# Patient Record
Sex: Female | Born: 1950
Health system: Southern US, Community
[De-identification: ages and names within clinical notes are randomized; demographics above are authoritative.]

## PROBLEM LIST (undated history)

## (undated) DIAGNOSIS — I480 Paroxysmal atrial fibrillation: Secondary | ICD-10-CM

## (undated) DIAGNOSIS — E069 Thyroiditis, unspecified: Secondary | ICD-10-CM

## (undated) DIAGNOSIS — E785 Hyperlipidemia, unspecified: Secondary | ICD-10-CM

## (undated) DIAGNOSIS — E038 Other specified hypothyroidism: Secondary | ICD-10-CM

## (undated) HISTORY — DX: Hyperlipidemia, unspecified: E78.5

## (undated) HISTORY — PX: OTHER SURGICAL HISTORY: SHX169

## (undated) HISTORY — DX: Paroxysmal atrial fibrillation: I48.0

## (undated) HISTORY — DX: Thyroiditis, unspecified: E06.9

## (undated) HISTORY — DX: Other specified hypothyroidism: E03.8

---

## 1998-04-20 ENCOUNTER — Other Ambulatory Visit: Admission: RE | Admit: 1998-04-20 | Discharge: 1998-04-20 | Payer: Self-pay | Admitting: Obstetrics and Gynecology

## 1999-05-15 ENCOUNTER — Ambulatory Visit (HOSPITAL_COMMUNITY): Admission: RE | Admit: 1999-05-15 | Discharge: 1999-05-15 | Payer: Self-pay | Admitting: Obstetrics and Gynecology

## 1999-05-15 ENCOUNTER — Encounter: Payer: Self-pay | Admitting: Obstetrics and Gynecology

## 2000-05-19 ENCOUNTER — Ambulatory Visit (HOSPITAL_COMMUNITY): Admission: RE | Admit: 2000-05-19 | Discharge: 2000-05-19 | Payer: Self-pay | Admitting: Obstetrics and Gynecology

## 2000-05-19 ENCOUNTER — Encounter: Payer: Self-pay | Admitting: Obstetrics and Gynecology

## 2001-06-05 ENCOUNTER — Encounter: Payer: Self-pay | Admitting: Obstetrics and Gynecology

## 2001-06-05 ENCOUNTER — Ambulatory Visit (HOSPITAL_COMMUNITY): Admission: RE | Admit: 2001-06-05 | Discharge: 2001-06-05 | Payer: Self-pay | Admitting: Obstetrics and Gynecology

## 2002-06-07 ENCOUNTER — Encounter: Payer: Self-pay | Admitting: Obstetrics and Gynecology

## 2002-06-07 ENCOUNTER — Ambulatory Visit (HOSPITAL_COMMUNITY): Admission: RE | Admit: 2002-06-07 | Discharge: 2002-06-07 | Payer: Self-pay | Admitting: Obstetrics and Gynecology

## 2003-06-23 ENCOUNTER — Ambulatory Visit (HOSPITAL_COMMUNITY): Admission: RE | Admit: 2003-06-23 | Discharge: 2003-06-23 | Payer: Self-pay | Admitting: Obstetrics and Gynecology

## 2003-06-23 ENCOUNTER — Encounter: Payer: Self-pay | Admitting: Obstetrics and Gynecology

## 2004-07-23 ENCOUNTER — Ambulatory Visit (HOSPITAL_COMMUNITY): Admission: RE | Admit: 2004-07-23 | Discharge: 2004-07-23 | Payer: Self-pay | Admitting: Obstetrics and Gynecology

## 2005-09-25 ENCOUNTER — Ambulatory Visit (HOSPITAL_COMMUNITY): Admission: RE | Admit: 2005-09-25 | Discharge: 2005-09-25 | Payer: Self-pay | Admitting: Obstetrics and Gynecology

## 2006-09-26 ENCOUNTER — Ambulatory Visit (HOSPITAL_COMMUNITY): Admission: RE | Admit: 2006-09-26 | Discharge: 2006-09-26 | Payer: Self-pay | Admitting: Obstetrics and Gynecology

## 2007-09-28 ENCOUNTER — Ambulatory Visit (HOSPITAL_COMMUNITY): Admission: RE | Admit: 2007-09-28 | Discharge: 2007-09-28 | Payer: Self-pay | Admitting: Obstetrics and Gynecology

## 2008-09-01 ENCOUNTER — Other Ambulatory Visit: Admission: RE | Admit: 2008-09-01 | Discharge: 2008-09-01 | Payer: Self-pay | Admitting: Family Medicine

## 2008-10-25 ENCOUNTER — Ambulatory Visit (HOSPITAL_COMMUNITY): Admission: RE | Admit: 2008-10-25 | Discharge: 2008-10-25 | Payer: Self-pay | Admitting: Family Medicine

## 2009-11-16 ENCOUNTER — Ambulatory Visit (HOSPITAL_COMMUNITY): Admission: RE | Admit: 2009-11-16 | Discharge: 2009-11-16 | Payer: Self-pay | Admitting: Family Medicine

## 2010-03-23 ENCOUNTER — Other Ambulatory Visit: Admission: RE | Admit: 2010-03-23 | Discharge: 2010-03-23 | Payer: Self-pay | Admitting: Family Medicine

## 2010-11-08 ENCOUNTER — Other Ambulatory Visit (HOSPITAL_COMMUNITY): Payer: Self-pay | Admitting: Family Medicine

## 2010-11-08 DIAGNOSIS — Z1231 Encounter for screening mammogram for malignant neoplasm of breast: Secondary | ICD-10-CM

## 2010-11-08 DIAGNOSIS — Z0000 Encounter for general adult medical examination without abnormal findings: Secondary | ICD-10-CM

## 2010-11-28 ENCOUNTER — Ambulatory Visit (HOSPITAL_COMMUNITY): Payer: BC Managed Care – PPO | Attending: Family Medicine

## 2010-11-28 ENCOUNTER — Ambulatory Visit (HOSPITAL_COMMUNITY): Admission: RE | Admit: 2010-11-28 | Payer: Self-pay | Source: Home / Self Care | Admitting: Family Medicine

## 2011-05-28 ENCOUNTER — Other Ambulatory Visit (HOSPITAL_COMMUNITY)
Admission: RE | Admit: 2011-05-28 | Discharge: 2011-05-28 | Disposition: A | Discharge status: Home / Self Care | Payer: BC Managed Care – PPO | Source: Ambulatory Visit | Attending: Family Medicine | Admitting: Family Medicine

## 2011-05-28 ENCOUNTER — Other Ambulatory Visit: Payer: Self-pay | Admitting: Family Medicine

## 2011-05-28 DIAGNOSIS — Z124 Encounter for screening for malignant neoplasm of cervix: Secondary | ICD-10-CM | POA: Insufficient documentation

## 2011-08-29 ENCOUNTER — Ambulatory Visit (HOSPITAL_COMMUNITY)
Admission: RE | Admit: 2011-08-29 | Discharge: 2011-08-29 | Disposition: A | Discharge status: Home / Self Care | Payer: BC Managed Care – PPO | Source: Ambulatory Visit | Attending: Family Medicine | Admitting: Family Medicine

## 2011-08-29 DIAGNOSIS — Z1231 Encounter for screening mammogram for malignant neoplasm of breast: Secondary | ICD-10-CM | POA: Insufficient documentation

## 2012-08-26 ENCOUNTER — Other Ambulatory Visit: Payer: Self-pay | Admitting: Dermatology

## 2013-08-19 ENCOUNTER — Other Ambulatory Visit (HOSPITAL_COMMUNITY): Payer: Self-pay | Admitting: Family Medicine

## 2013-08-19 DIAGNOSIS — Z1231 Encounter for screening mammogram for malignant neoplasm of breast: Secondary | ICD-10-CM

## 2013-09-02 ENCOUNTER — Ambulatory Visit (HOSPITAL_COMMUNITY)
Admission: RE | Admit: 2013-09-02 | Discharge: 2013-09-02 | Disposition: A | Discharge status: Home / Self Care | Payer: BC Managed Care – PPO | Source: Ambulatory Visit | Attending: Family Medicine | Admitting: Family Medicine

## 2013-09-02 DIAGNOSIS — Z1231 Encounter for screening mammogram for malignant neoplasm of breast: Secondary | ICD-10-CM

## 2014-01-06 ENCOUNTER — Other Ambulatory Visit (HOSPITAL_COMMUNITY)
Admission: RE | Admit: 2014-01-06 | Discharge: 2014-01-06 | Disposition: A | Discharge status: Home / Self Care | Payer: BC Managed Care – PPO | Source: Ambulatory Visit | Attending: Family Medicine | Admitting: Family Medicine

## 2014-01-06 ENCOUNTER — Other Ambulatory Visit: Payer: Self-pay | Admitting: Family Medicine

## 2014-01-06 DIAGNOSIS — Z124 Encounter for screening for malignant neoplasm of cervix: Secondary | ICD-10-CM | POA: Insufficient documentation

## 2014-08-30 ENCOUNTER — Other Ambulatory Visit (HOSPITAL_COMMUNITY): Payer: Self-pay | Admitting: Family Medicine

## 2014-08-30 DIAGNOSIS — Z1231 Encounter for screening mammogram for malignant neoplasm of breast: Secondary | ICD-10-CM

## 2014-09-23 ENCOUNTER — Ambulatory Visit (HOSPITAL_COMMUNITY)
Admission: RE | Admit: 2014-09-23 | Discharge: 2014-09-23 | Disposition: A | Discharge status: Home / Self Care | Payer: BC Managed Care – PPO | Source: Ambulatory Visit | Attending: Family Medicine | Admitting: Family Medicine

## 2014-09-23 DIAGNOSIS — Z1231 Encounter for screening mammogram for malignant neoplasm of breast: Secondary | ICD-10-CM | POA: Diagnosis not present

## 2014-09-29 ENCOUNTER — Other Ambulatory Visit: Payer: Self-pay | Admitting: Family Medicine

## 2014-09-29 DIAGNOSIS — R1032 Left lower quadrant pain: Secondary | ICD-10-CM

## 2014-10-04 ENCOUNTER — Other Ambulatory Visit: Payer: BC Managed Care – PPO

## 2014-10-10 ENCOUNTER — Ambulatory Visit
Admission: RE | Admit: 2014-10-10 | Discharge: 2014-10-10 | Disposition: A | Discharge status: Home / Self Care | Payer: BC Managed Care – PPO | Source: Ambulatory Visit | Attending: Family Medicine | Admitting: Family Medicine

## 2014-10-10 DIAGNOSIS — R1032 Left lower quadrant pain: Secondary | ICD-10-CM

## 2015-09-01 ENCOUNTER — Other Ambulatory Visit: Payer: Self-pay

## 2015-09-01 DIAGNOSIS — Z1231 Encounter for screening mammogram for malignant neoplasm of breast: Secondary | ICD-10-CM

## 2015-10-11 ENCOUNTER — Ambulatory Visit
Admission: RE | Admit: 2015-10-11 | Discharge: 2015-10-11 | Disposition: A | Discharge status: Home / Self Care | Payer: BC Managed Care – PPO | Source: Ambulatory Visit

## 2015-10-11 DIAGNOSIS — Z1231 Encounter for screening mammogram for malignant neoplasm of breast: Secondary | ICD-10-CM

## 2016-09-15 ENCOUNTER — Emergency Department (HOSPITAL_BASED_OUTPATIENT_CLINIC_OR_DEPARTMENT_OTHER)
Admission: EM | Admit: 2016-09-15 | Discharge: 2016-09-15 | Disposition: A | Discharge status: Home / Self Care | Payer: BC Managed Care – PPO | Attending: Emergency Medicine | Admitting: Emergency Medicine

## 2016-09-15 ENCOUNTER — Encounter (HOSPITAL_BASED_OUTPATIENT_CLINIC_OR_DEPARTMENT_OTHER): Payer: Self-pay | Admitting: Adult Health

## 2016-09-15 ENCOUNTER — Emergency Department (HOSPITAL_BASED_OUTPATIENT_CLINIC_OR_DEPARTMENT_OTHER): Payer: BC Managed Care – PPO

## 2016-09-15 DIAGNOSIS — Y999 Unspecified external cause status: Secondary | ICD-10-CM | POA: Insufficient documentation

## 2016-09-15 DIAGNOSIS — Z79899 Other long term (current) drug therapy: Secondary | ICD-10-CM | POA: Insufficient documentation

## 2016-09-15 DIAGNOSIS — Y93F2 Activity, caregiving, lifting: Secondary | ICD-10-CM | POA: Diagnosis not present

## 2016-09-15 DIAGNOSIS — M546 Pain in thoracic spine: Secondary | ICD-10-CM | POA: Diagnosis not present

## 2016-09-15 DIAGNOSIS — X500XXA Overexertion from strenuous movement or load, initial encounter: Secondary | ICD-10-CM | POA: Diagnosis not present

## 2016-09-15 DIAGNOSIS — Z87891 Personal history of nicotine dependence: Secondary | ICD-10-CM | POA: Insufficient documentation

## 2016-09-15 DIAGNOSIS — M549 Dorsalgia, unspecified: Secondary | ICD-10-CM

## 2016-09-15 DIAGNOSIS — Y929 Unspecified place or not applicable: Secondary | ICD-10-CM | POA: Insufficient documentation

## 2016-09-15 DIAGNOSIS — M7918 Myalgia, other site: Secondary | ICD-10-CM

## 2016-09-15 DIAGNOSIS — S299XXA Unspecified injury of thorax, initial encounter: Secondary | ICD-10-CM | POA: Diagnosis present

## 2016-09-15 LAB — CBC
HCT: 42.7 % (ref 36.0–46.0)
Hemoglobin: 14.1 g/dL (ref 12.0–15.0)
MCH: 29.6 pg (ref 26.0–34.0)
MCHC: 33 g/dL (ref 30.0–36.0)
MCV: 89.5 fL (ref 78.0–100.0)
PLATELETS: 257 10*3/uL (ref 150–400)
RBC: 4.77 MIL/uL (ref 3.87–5.11)
RDW: 13.5 % (ref 11.5–15.5)
WBC: 7.7 10*3/uL (ref 4.0–10.5)

## 2016-09-15 LAB — BASIC METABOLIC PANEL
Anion gap: 11 (ref 5–15)
BUN: 18 mg/dL (ref 6–20)
CALCIUM: 9.4 mg/dL (ref 8.9–10.3)
CHLORIDE: 105 mmol/L (ref 101–111)
CO2: 25 mmol/L (ref 22–32)
CREATININE: 0.99 mg/dL (ref 0.44–1.00)
GFR calc Af Amer: >60 mL/min (ref >60)
GFR, EST NON AFRICAN AMERICAN: 59 mL/min — AB (ref >60)
Glucose, Bld: 98 mg/dL (ref 65–99)
Potassium: 3.4 mmol/L — ABNORMAL LOW (ref 3.5–5.1)
SODIUM: 141 mmol/L (ref 135–145)

## 2016-09-15 LAB — TROPONIN I: Troponin I: <0.03 ng/mL (ref <0.03)

## 2016-09-15 MED ORDER — NAPROXEN 250 MG PO TABS
500.0000 mg | ORAL_TABLET | Freq: Once | ORAL | Status: AC
  Administered 2016-09-15: 500 mg via ORAL
  Filled 2016-09-15: qty 2

## 2016-09-15 NOTE — ED Provider Notes (Signed)
[facility] Provider Note   CSN: [id number] Arrival date & time: 09/15/16  1229     History   Chief Complaint Chief Complaint  Patient presents with  . Chest Pain    HPI Nicole Haynes is a 65 y.o. female.  HPI 65 y.o. female, presents to the Emergency Department today due to pain between shoulder blades  For several weeks. Sent by UC to r/o MI. Notes fatigue, shortness of breath while lying down. Pt does not lifting furniture several weeks ago when original pain started between shoulder blades. No nausea. Notes pain a dull ache and rates 1/10. Notes no N/V. No fevers. No diaphoresis. No numbness/tingling. No radiation. No hx MI in past. No FH ACS. Risk Factors: HLD. Has never ad a stress test. No recent travel. No hx DVT/PE. No recent surgeries. No other symptoms noted.  Past Medical History:  Diagnosis Date  . Thyroid disease     There are no active problems to display for this patient.   History reviewed. No pertinent surgical history.  OB History    No data available       Home Medications    Prior to Admission medications   Medication Sig Start Date End Date Taking? Authorizing Provider  levothyroxine (SYNTHROID, LEVOTHROID) 125 MCG tablet Take 125 mcg by mouth daily before breakfast.   Yes Historical Provider, MD    Family History History reviewed. No pertinent family history.  Social History Social History  Substance Use Topics  . Smoking status: Former Games developermoker  . Smokeless tobacco: Never Used  . Alcohol use Yes     Comment: wine     Allergies   Penicillins   Review of Systems Review of Systems ROS reviewed and all are negative for acute change except as noted in the HPI.  Physical Exam Updated Vital Signs BP 157/83   Pulse 83   Temp 98.2 F (36.8 C) (Oral)   Resp 19   Ht 5\' 4"  (1.626 m)   Wt 65.8 kg   SpO2 100%   BMI 24.89 kg/m   Physical Exam  Constitutional: She is oriented to person, place, and time. Vital signs  are normal. She appears well-developed and well-nourished.  HENT:  Head: Normocephalic.  Right Ear: Hearing normal.  Left Ear: Hearing normal.  Eyes: Conjunctivae and EOM are normal. Pupils are equal, round, and reactive to light.  Neck: Normal range of motion. Neck supple.  Cardiovascular: Normal rate, regular rhythm, normal heart sounds and intact distal pulses.   Pulmonary/Chest: Effort normal and breath sounds normal.  Musculoskeletal: Normal range of motion.  Neurological: She is alert and oriented to person, place, and time.  Skin: Skin is warm and dry.  Psychiatric: She has a normal mood and affect. Her speech is normal and behavior is normal. Thought content normal.  Nursing note and vitals reviewed.  ED Treatments / Results  Labs (all labs ordered are listed, but only abnormal results are displayed) Labs Reviewed  BASIC METABOLIC PANEL - Abnormal; Notable for the following:       Result Value   Potassium 3.4 (*)    GFR calc non Af Amer 59 (*)    All other components within normal limits  CBC  TROPONIN I  TROPONIN I   EKG  EKG Interpretation  Date/Time:  Sunday September 15 2016 13:30:20 EST Ventricular Rate:  80 PR Interval:    QRS Duration: 98 QT Interval:  386 QTC Calculation: 446 R Axis:   -6  Text Interpretation:  Sinus rhythm Low voltage, precordial leads No old tracing to compare Confirmed by [facility]  MD, MARTHA [phone]) on 09/15/2016 1:33:22 PM Also confirmed by Karma GanjaLINKER  MD, MARTHA [phone]), editor [place]  CCT, BEVERLY ([id number])  on 09/15/2016 1:47:50 PM      Radiology Dg Chest 2 View  Result Date: 09/15/2016 CLINICAL DATA:  Upper back pain with shortness of Breath EXAM: CHEST  2 VIEW COMPARISON:  None. FINDINGS: Cardiac shadow is within normal limits. Lungs are well aerated bilaterally. Minimal atelectatic changes are noted in the lingula. No sizable effusion is seen. No bony abnormality is noted. IMPRESSION: Mild lingular atelectasis. Electronically Signed    By: Alcide CleverMark  Lukens M.D.   On: 09/15/2016 14:23    Procedures Procedures (including critical care time)  Medications Ordered in ED Medications - No data to display  Initial Impression / Assessment and Plan / ED Course  I have reviewed the triage vital signs and the nursing notes.  Pertinent labs & imaging results that were available during my care of the patient were reviewed by me and considered in my medical decision making (see chart for details).  Clinical Course    Final Clinical Impressions(s) / ED Diagnoses  {I have reviewed and evaluated the relevant laboratory values. {I have reviewed and evaluated the relevant imaging studies. {I have interpreted the relevant EKG. {I have reviewed the relevant previous healthcare records.  {I obtained HPI from historian. {Patient discussed with supervising physician.  ED Course:  Assessment: Pt is a 65yF presents with shoulder pain in back x several weeks s/p lifting injury. Continued pain. Seen by UC and sent to r/o MI.Marland Kitchen. Risk Factors HLD. Patient is to be discharged with recommendation to follow up with PCP in regards to today's hospital visit. Chest pain is not likely of cardiac or pulmonary etiology d/t presentation, VSS, no tracheal deviation, no JVD or new murmur, RRR, breath sounds equal bilaterally, EKG without acute abnormalities, negative troponin, and negative CXR. Heart Score 2. Low indication for PE as pt without risk factors. Non pleuritic pain. VSS. Does not meet Wells Criteria. Improvement of pain with antiinflammatories. Will DC patient home when delta troponin negative. Pt has been advised start a NSAIDs and return to the ED is CP becomes exertional, associated with diaphoresis or nausea, radiates to left jaw/arm, worsens or becomes concerning in any way. Pt appears reliable for follow up and is agreeable to discharge if delta troponin unremarkable. Patient is in no acute distress. Vital Signs are stable. Patient is able to ambulate.  Patient able to tolerate PO.   Disposition/Plan:  DC Home Additional Verbal discharge instructions given and discussed with patient.  Pt Instructed to f/u with PCP in the next week for evaluation and treatment of symptoms. Return precautions given Pt acknowledges and agrees with plan  Supervising Physician Jerelyn ScottMartha Linker, MD  Final diagnoses:  Upper back pain  Musculoskeletal pain    New Prescriptions New Prescriptions   No medications on file     Audry Piliyler Dayton Sherr, PA-C 09/15/16 1630    Jerelyn ScottMartha Linker, MD 09/19/16 [phone]

## 2016-09-15 NOTE — ED Triage Notes (Addendum)
PT sent over from [facility] to R/O MI, Endorses pain between the shoulder blades for a few weeks and it has progressed to fatigue, palpitations and SOB when lying down. Denies nausea. Pain comes and goes.  Nothing makes pain worse, nothing makes pain better, pain is described a sa dull pain/ache

## 2016-09-15 NOTE — ED Notes (Signed)
ED Provider at bedside. 

## 2016-09-15 NOTE — Discharge Instructions (Addendum)
Please read and follow all provided instructions.  Your diagnoses today include:  1. Upper back pain   2. Musculoskeletal pain    Tests performed today include: An EKG of your heart A chest x-ray Cardiac enzymes - a blood test for heart muscle damage Blood counts and electrolytes Vital signs. See below for your results today.   Medications prescribed:   Take any prescribed medications only as directed.  Follow-up instructions: Please follow-up with your primary care provider as soon as you can for further evaluation of your symptoms. Likely they will schedule a STRESS TEST to further evaluate any Cardiac etiology of the discomfort you are having.   Return instructions:  SEEK IMMEDIATE MEDICAL ATTENTION IF: You have severe chest pain, especially if the pain is crushing or pressure-like and spreads to the arms, back, neck, or jaw, or if you have sweating, nausea (feeling sick to your stomach), or shortness of breath. THIS IS AN EMERGENCY. Don't wait to see if the pain will go away. Get medical help at once. Call 911 or 0 (operator). DO NOT drive yourself to the hospital.  Your chest pain gets worse and does not go away with rest.  You have an attack of chest pain lasting longer than usual, despite rest and treatment with the medications your caregiver has prescribed.  You wake from sleep with chest pain or shortness of breath. You feel dizzy or faint. You have chest pain not typical of your usual pain for which you originally saw your caregiver.  You have any other emergent concerns regarding your health.  Additional Information: Chest pain comes from many different causes. Your caregiver has diagnosed you as having chest pain that is not specific for one problem, but does not require admission.  You are at low risk for an acute heart condition or other serious illness.   Your vital signs today were: BP 157/83    Pulse 83    Temp 98.2 F (36.8 C) (Oral)    Resp 19    Ht 5\' 4"  (1.626  m)    Wt 65.8 kg    SpO2 100%    BMI 24.89 kg/m  If your blood pressure (BP) was elevated above 135/85 this visit, please have this repeated by your doctor within one month. --------------

## 2016-09-20 ENCOUNTER — Other Ambulatory Visit: Payer: Self-pay | Admitting: Family Medicine

## 2016-09-20 DIAGNOSIS — Z1231 Encounter for screening mammogram for malignant neoplasm of breast: Secondary | ICD-10-CM

## 2016-10-31 ENCOUNTER — Ambulatory Visit
Admission: RE | Admit: 2016-10-31 | Discharge: 2016-10-31 | Disposition: A | Discharge status: Home / Self Care | Payer: BC Managed Care – PPO | Source: Ambulatory Visit | Attending: Family Medicine | Admitting: Family Medicine

## 2016-10-31 DIAGNOSIS — Z1231 Encounter for screening mammogram for malignant neoplasm of breast: Secondary | ICD-10-CM

## 2017-10-01 ENCOUNTER — Other Ambulatory Visit: Payer: Self-pay | Admitting: Family Medicine

## 2017-10-01 DIAGNOSIS — Z1231 Encounter for screening mammogram for malignant neoplasm of breast: Secondary | ICD-10-CM

## 2017-11-03 ENCOUNTER — Ambulatory Visit
Admission: RE | Admit: 2017-11-03 | Discharge: 2017-11-03 | Disposition: A | Discharge status: Home / Self Care | Payer: BC Managed Care – PPO | Source: Ambulatory Visit | Attending: Family Medicine | Admitting: Family Medicine

## 2017-11-03 ENCOUNTER — Other Ambulatory Visit: Payer: Self-pay | Admitting: Family Medicine

## 2017-11-03 ENCOUNTER — Ambulatory Visit (INDEPENDENT_AMBULATORY_CARE_PROVIDER_SITE_OTHER): Payer: Medicare Other

## 2017-11-03 DIAGNOSIS — R002 Palpitations: Secondary | ICD-10-CM | POA: Diagnosis not present

## 2017-11-03 DIAGNOSIS — Z1231 Encounter for screening mammogram for malignant neoplasm of breast: Secondary | ICD-10-CM

## 2017-11-04 ENCOUNTER — Other Ambulatory Visit: Payer: Self-pay | Admitting: Family Medicine

## 2017-11-04 DIAGNOSIS — R928 Other abnormal and inconclusive findings on diagnostic imaging of breast: Secondary | ICD-10-CM

## 2017-11-06 ENCOUNTER — Ambulatory Visit
Admission: RE | Admit: 2017-11-06 | Discharge: 2017-11-06 | Disposition: A | Discharge status: Home / Self Care | Payer: Medicare Other | Source: Ambulatory Visit | Attending: Family Medicine | Admitting: Family Medicine

## 2017-11-06 ENCOUNTER — Other Ambulatory Visit: Payer: Self-pay | Admitting: Family Medicine

## 2017-11-06 DIAGNOSIS — R928 Other abnormal and inconclusive findings on diagnostic imaging of breast: Secondary | ICD-10-CM

## 2017-11-18 ENCOUNTER — Telehealth: Payer: Self-pay | Admitting: Nurse Practitioner

## 2017-11-18 NOTE — Telephone Encounter (Signed)
Received monitor report from [employer] dated 11/14/17 @ 4:11 pm (CT) which reports patient having atrial fib with RVR. I reviewed the monitor report with Dr. Mayford Knifeurner, DOD, who advised the monitor shows atrial tachycardia. She advised that I notify Dr. Valentina LucksGriffin, the ordering physician. I left a message at Dr. Jone BasemanGriffin's office with receptionist who states Dr. Valentina LucksGriffin is out of the office today. She repeated the message back to me and thanked me for the call.

## 2017-11-21 ENCOUNTER — Encounter: Payer: Self-pay | Admitting: Cardiology

## 2017-11-21 ENCOUNTER — Ambulatory Visit: Payer: Medicare Other | Admitting: Cardiology

## 2017-11-21 VITALS — BP 122/62 | Ht 64.0 in | Wt 144.0 lb

## 2017-11-21 DIAGNOSIS — I471 Supraventricular tachycardia: Secondary | ICD-10-CM | POA: Diagnosis not present

## 2017-11-21 DIAGNOSIS — I480 Paroxysmal atrial fibrillation: Secondary | ICD-10-CM | POA: Insufficient documentation

## 2017-11-21 HISTORY — PX: OTHER SURGICAL HISTORY: SHX169

## 2017-11-21 MED ORDER — ASPIRIN EC 81 MG PO TBEC: 81.0000 mg | DELAYED_RELEASE_TABLET | Freq: Every day | ORAL | 3 refills | Status: DC

## 2017-11-21 MED ORDER — DILTIAZEM HCL ER COATED BEADS 120 MG PO CP24: 120.0000 mg | ORAL_CAPSULE | Freq: Every day | ORAL | 6 refills | Status: DC

## 2017-11-21 NOTE — Progress Notes (Signed)
PCP: Maurice Small, MD  Clinic Note: Chief Complaint  Patient presents with  . New Patient (Initial Visit)  . Palpitations    HPI: Nicole Haynes is a 67 y.o. female with a PMH below who presents today for evaluation of rapid irregular heartbeats (likely atrial fibrillation on monitor) at the request of Maurice Small, MD.  Nicole Haynes was seen on October 24, 2017 with complaints of heart racing and skipping.  They usually happen in the evening when she is quiet and still.  Lasting up to 30 minutes at a time.  No associated chest pain or shortness of breath.  Had noticed episodes going off and on for about a year but worse now. Mother has history of some type of arrhythmia and ended up with a pacemaker.   30-day event monitor ordered --> Dr. Carolanne Grumbling (on call "cardiology fellow ") was notified of rapid rhythm, diagnosed telephonically as atrial tachycardia.  Recent Hospitalizations: None  Studies Personally Reviewed - (if available, images/films reviewed: From Epic Chart or Care Everywhere)  3 event monitor reports were obtained prior to completion of the monitor.  These episodes show a standard sinus bradycardia/sinus rhythm for baseline followed by 3 episodes of atrial fibrillation with RVR rates up to 130 bpm. -->  On my review, this clearly appears to be atrial fibrillation and not atrial tachycardia.  Episodes occurred on January 23, January 27 and January 28.  Nicole Haynes presents today still noticing these rapid heart rate spells.  Again she tells me that  Interval History: She really only notes that at nighttime when she has had time to calm down and relax.  She notes that they can last anywhere from a few minutes to up to 30 and 40 minutes.  Up until last month, they may have happened once or twice a month, but now are happening with increased frequency.  Last major episode she felt was Christmas time, but since wearing the monitor, she has noted at least 2 episodes where  she was contacted, that she indicated having symptoms. She describes it as a irregular fast heartbeat that makes her feel short of breath when it happens, but not otherwise.  If the discomfort in her chest, but not pain.  She has not had any syncope or near syncope type episodes but she had a prolonged episode where she felt like she "freaked out a bit "and almost passed out.  Otherwise, has not had near syncopal symptoms just the dizziness.  No true syncope.  When she is not having these episodes, she really is a systematic from a cardiac standpoint.  No chest pain or shortness of breath with rest or exertion.  No PND, orthopnea or edema.  No TIA/amaurosis fugax symptoms. No melena, hematochezia, hematuria, or epstaxis. No claudication.  She has not had her thyroid medication changed recently.  She does drink roughly 2-2-1/2 cups of coffee a day, but denies any other type of stimulant use.  ROS: A comprehensive was performed. Review of Systems  Constitutional: Negative for malaise/fatigue and weight loss.  HENT: Negative for congestion and nosebleeds.   Respiratory: Negative for cough, shortness of breath and wheezing.   Gastrointestinal: Negative for abdominal pain, constipation, diarrhea and heartburn.  Genitourinary: Negative for dysuria.  Musculoskeletal: Negative for back pain, joint pain and myalgias.  Neurological: Positive for dizziness. Negative for focal weakness, seizures and loss of consciousness.  Endo/Heme/Allergies: Does not bruise/bleed easily.  Psychiatric/Behavioral: The patient is nervous/anxious (She does get anxious when  these spells last long time.).   All other systems reviewed and are negative.   I have reviewed and (if needed) personally updated the patient's problem list, medications, allergies, past medical and surgical history, social and family history.   Past Medical History:  Diagnosis Date  . Dyslipidemia    On rosuvastatin, Krill oral  .  Hypothyroidism due to Graves' disease status post radiation iodide    On levothyroxine    Past Surgical History:  Procedure Laterality Date  . None      Current Meds  Medication Sig  . Krill Oil 500 MG CAPS Take 500 mg by mouth daily.  Marland Kitchen. levothyroxine (SYNTHROID, LEVOTHROID) 100 MCG tablet Take 100 mcg by mouth daily before breakfast.   . Magnesium 250 MG TABS Take 250 mg by mouth daily.  . Riboflavin (B2) 100 MG TABS Take by mouth.  . rosuvastatin (CRESTOR) 5 MG tablet Take 5 mg by mouth daily.  . TURMERIC CURCUMIN PO Take 100 mg by mouth daily.  . Vitamin D, Ergocalciferol, (DRISDOL) 50000 units CAPS capsule TAKE 1 CAPSULE BY MOUTH ONCE A WEEK ORALLY    Allergies  Allergen Reactions  . Penicillins     Social History   Tobacco Use  . Smoking status: Former Games developermoker  . Smokeless tobacco: Never Used  Substance Use Topics  . Alcohol use: Yes    Comment: wine  . Drug use: No   Social History   Social History Narrative   She is a married mother of 2.   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   She exercises about 3 days a week walking about 10 miles a week total.  She also enjoys yoga and routine hiking.  (Dedicated walking 60 minutes 2 days a week)   1-2 glasses of alcohol/wine per week.   Caffeine 2-1/2 cups daily.   Non-smoker but history of exposure to passive smoke.    family history is not on file.  Wt Readings from Last 3 Encounters:  11/21/17 144 lb (65.3 kg)  09/15/16 145 lb (65.8 kg)    PHYSICAL EXAM BP 122/62 (BP Location: Right Arm)   Ht 5\' 4"  (1.626 m)   Wt 144 lb (65.3 kg)   BMI 24.72 kg/m  Physical Exam  Constitutional: She is oriented to person, place, and time. She appears well-developed and well-nourished. No distress.  Healthy-appearing.  Well-groomed  HENT:  Head: Normocephalic and atraumatic.  Eyes: EOM are normal. Pupils are equal, round, and reactive to light. No scleral icterus.  Neck: Normal range of motion. Neck  supple. No hepatojugular reflux and no JVD present. Carotid bruit is not present. No thyromegaly present.  Cardiovascular: Normal rate, regular rhythm, normal heart sounds and intact distal pulses.  No extrasystoles are present. PMI is not displaced. Exam reveals no gallop and no friction rub.  No murmur heard. Pulmonary/Chest: Effort normal and breath sounds normal. No respiratory distress. She has no wheezes. She has no rales.  Abdominal: Soft. Bowel sounds are normal. She exhibits no distension. There is no tenderness.  Musculoskeletal: Normal range of motion. She exhibits no edema.  Neurological: She is alert and oriented to person, place, and time. No cranial nerve deficit.  Skin: Skin is warm and dry. No rash noted. No erythema.  Psychiatric: She has a normal mood and affect. Her behavior is normal. Judgment and thought content normal.     Adult ECG Report  Rate: 62;  Rhythm: normal sinus rhythm and Borderline low voltage.  Nonspecific ST and T wave changes.  Otherwise normal axis, intervals and durations;   Narrative Interpretation: Normal EKG Compared to PCPs EKG showing sinus bradycardia, rate 52 bpm with borderline negative precordial T waves, no notable change.   Other studies Reviewed: Additional studies/ records that were reviewed today include:  Recent Labs: October 06 2017:  Na+ 140, K+ 4.0, Cl- 103, HCO3-   28, BUN 17, Cr 1.01, Glu 77, Ca2+ 9.6; AST 21, ALT 14, AlkP 57  CBC: W 8.2, H/H 14.0/41.9, Plt 252; TSH 0.36 (lowest end of normal)  TC 176, TG 73, HDL 64, LDL 97     ASSESSMENT / PLAN: Initial read of monitor appears to be atrial fibrillation, however cannot be clear.  We will wait until the full monitor report is complete and see her back after this is done along with echocardiogram prior to determining further treatment beyond starting low-dose calcium channel blocker for rate control.  Problem List Items Addressed This Visit    Atrial tachycardia (HCC) vs  Atrial Fibriallation - Primary    On event monitor, this looks like his atrial fibrillation.  I will review with colleagues, but I am quite sure that is probably what it is.  For now we will start her on diltiazem 120 mg daily along with aspirin 81 mg.  We will need more data to determine true CHA2DS2Vasc score.   Plan: Start diltiazem XT 120 mg daily, baby aspirin  Check 2D echo  Will need to consider ischemic evaluation as well -will discuss with her in follow-up, but likely consider Myoview        Relevant Medications   rosuvastatin (CRESTOR) 5 MG tablet   aspirin EC 81 MG tablet   diltiazem (CARDIZEM CD) 120 MG 24 hr capsule   Other Relevant Orders   EKG 12-Lead (Completed)   ECHOCARDIOGRAM COMPLETE      Current medicines are reviewed at length with the patient today. (+/- concerns) n/a The following changes have been made: see below  Patient Instructions  MEDICATION INSTRUCTIONS  --START DILTIAZEM 120 MG ONE CAPSULE DAILY ---ASPIRIN 81 MG ONE TABLET DAILY    SCHEDULE AT [address] Your physician has requested that you have an echocardiogram. Echocardiography is a painless test that uses sound waves to create images of your heart. It provides your doctor with information about the size and shape of your heart and how well your heart's chambers and valves are working. This procedure takes approximately one hour. There are no restrictions for this procedure.   KEEP WEARING MONITOR UNTIL COMPLETE   Your physician recommends that you schedule a follow-up appointment in 1 MONTH WITH DR Arlan Birks.    Studies Ordered:   Orders Placed This Encounter  Procedures  . EKG 12-Lead  . ECHOCARDIOGRAM COMPLETE      [name], M.D., M.S. Interventional Cardiologist   Pager # [phone] Phone # [phone] [address] [place], [place] [address]   Thank you for choosing [facility]!!

## 2017-11-21 NOTE — Patient Instructions (Signed)
MEDICATION INSTRUCTIONS  --START DILTIAZEM 120 MG ONE CAPSULE DAILY ---ASPIRIN 81 MG ONE TABLET DAILY    SCHEDULE AT [address] Your physician has requested that you have an echocardiogram. Echocardiography is a painless test that uses sound waves to create images of your heart. It provides your doctor with information about the size and shape of your heart and how well your heart's chambers and valves are working. This procedure takes approximately one hour. There are no restrictions for this procedure.   KEEP WEARING MONITOR UNTIL COMPLETE   Your physician recommends that you schedule a follow-up appointment in 1 MONTH WITH DR HARDING.

## 2017-11-27 ENCOUNTER — Encounter: Payer: Self-pay | Admitting: Cardiology

## 2017-11-27 NOTE — Assessment & Plan Note (Signed)
On event monitor, this looks like his atrial fibrillation.  I will review with colleagues, but I am quite sure that is probably what it is.  For now we will start her on diltiazem 120 mg daily along with aspirin 81 mg.  We will need more data to determine true CHA2DS2Vasc score.   Plan: Start diltiazem XT 120 mg daily, baby aspirin  Check 2D echo  Will need to consider ischemic evaluation as well -will discuss with her in follow-up, but likely consider Myoview

## 2017-11-28 ENCOUNTER — Other Ambulatory Visit: Payer: Self-pay

## 2017-11-28 ENCOUNTER — Ambulatory Visit (HOSPITAL_COMMUNITY): Payer: Medicare Other | Attending: Cardiovascular Disease

## 2017-11-28 DIAGNOSIS — I082 Rheumatic disorders of both aortic and tricuspid valves: Secondary | ICD-10-CM | POA: Diagnosis not present

## 2017-11-28 DIAGNOSIS — I471 Supraventricular tachycardia: Secondary | ICD-10-CM | POA: Diagnosis not present

## 2017-11-28 HISTORY — PX: TRANSTHORACIC ECHOCARDIOGRAM: SHX275

## 2017-12-19 HISTORY — PX: NM MYOVIEW LTD: HXRAD82

## 2017-12-25 ENCOUNTER — Ambulatory Visit: Payer: Medicare Other | Admitting: Cardiology

## 2017-12-25 ENCOUNTER — Encounter: Payer: Self-pay | Admitting: Cardiology

## 2017-12-25 VITALS — BP 137/74 | HR 53 | Ht 64.0 in | Wt 148.0 lb

## 2017-12-25 DIAGNOSIS — R0609 Other forms of dyspnea: Secondary | ICD-10-CM | POA: Diagnosis not present

## 2017-12-25 DIAGNOSIS — I480 Paroxysmal atrial fibrillation: Secondary | ICD-10-CM | POA: Diagnosis not present

## 2017-12-25 MED ORDER — APIXABAN 5 MG PO TABS: 5.0000 mg | ORAL_TABLET | Freq: Two times a day (BID) | ORAL | 11 refills | Status: DC

## 2017-12-25 NOTE — Progress Notes (Signed)
PCP: Maurice SmallGriffin, Elaine, MD  Clinic Note: Chief Complaint  Patient presents with  . Follow-up    Notably improved symptoms after starting diltiazem.  . Atrial Fibrillation  . Fatigue    HPI: Nicole Haynes is a 67 y.o. female paroxysmal atrial fibrillation noted on cardiac event monitor.  She was initially seen on February first at the request of Maurice SmallGriffin, Elaine, MD.  Nicole Haynes continue to wear her event monitor  (was wearing it when I saw her).  The initial read by the on-call cardiologist had suggested atrial tachycardia, however I felt like her monitor was showing atrial fibrillation, and the formal read came out as atrial fibrillation.  She was started on diltiazem.  Recent Hospitalizations: None  Studies Personally Reviewed - (if available, images/films reviewed: From Epic Chart or Care Everywhere)  30-day event monitor completed: Read as normal sinus rhythm 98% of the time but paroxysmal A. fib noted with rates up to 160 bpm  2D echo: (February 8,2019).  Normal function, EF 60-65%.  Normal wall motion.  Mild aortic regurgitation.  Mild to moderate tricuspid regurgitation with mildly elevated peak pulmonary pressures.  Interval History: Nicole Haynes presents here today actually feeling much better having started her diltiazem.  She says she felt a dramatic change about a day or so after she started taking it.  She said that her heart rate notably dropped and she he really cannot recall having another episode of the fast heartbeats -she has been following her heart rates wearing her Fitbit..  She feels like she now has occasional episodes that are more like an anxiety spell where she feels just a short-lived couple second flutter.  This is usually when she is anxious or stressed about something.  Is not associated with chest pain or shortness of breath.  She denies any chest tightness or pressure with rest or exertion, but does note having some dyspnea going upstairs which is usually  worse if she is rushing or carrying something.  She may have a little bit of just discomfort in her chest but not pressure or tightness that was more notable when she was having a fast heartbeat spells. -   She denies any heart failure symptoms of PND, orthopnea or edema.  No syncope/near syncope or TIA/amaurosis fugax -and less of the dizzy spells because of less palpitations.. No melena, hematochezia, hematuria, epistaxis. No claudication.   ROS: A comprehensive was performed. Review of Systems  Constitutional: Negative for malaise/fatigue and weight loss.  HENT: Negative for congestion and nosebleeds.   Respiratory: Negative for cough, shortness of breath and wheezing.   Cardiovascular: Negative for claudication.  Gastrointestinal: Negative for abdominal pain, constipation, diarrhea and heartburn.  Genitourinary: Negative for dysuria.  Musculoskeletal: Negative for back pain, joint pain and myalgias.  Neurological: Negative for dizziness (Notably improved), focal weakness, seizures and loss of consciousness.  Endo/Heme/Allergies: Does not bruise/bleed easily.  Psychiatric/Behavioral: The patient is nervous/anxious (She does get anxious when these spells last long time.).   All other systems reviewed and are negative.   I have reviewed and (if needed) personally updated the patient's problem list, medications, allergies, past medical and surgical history, social and family history.   Past Medical History:  Diagnosis Date  . Dyslipidemia    On rosuvastatin, Krill oral  . Hypothyroidism due to Graves' disease status post radiation iodide    On levothyroxine    Past Surgical History:  Procedure Laterality Date  . CARDIAC EVENT MONITOR  11/2017  30 monitor: 98% normal sinus rhythm.  2% PAF with rates as high as 160 bpm.  . None    . TRANSTHORACIC ECHOCARDIOGRAM  11/28/2017   Normal LV size and function.  EF 60-65%.  Wall motion.  Mild AI, Mild-mod TR with mildly elevated PA  pressures.    Current Meds  Medication Sig  . aspirin EC 81 MG tablet Take 1 tablet (81 mg total) by mouth daily.  Marland Kitchen diltiazem (CARDIZEM CD) 120 MG 24 hr capsule Take 1 capsule (120 mg total) by mouth daily.  Boris Lown Oil 500 MG CAPS Take 500 mg by mouth daily.  Marland Kitchen levothyroxine (SYNTHROID, LEVOTHROID) 100 MCG tablet Take 100 mcg by mouth daily before breakfast.   . Magnesium 250 MG TABS Take 250 mg by mouth daily.  . Riboflavin (B2) 100 MG TABS Take by mouth.  . rosuvastatin (CRESTOR) 5 MG tablet Take 5 mg by mouth daily.  . TURMERIC CURCUMIN PO Take 100 mg by mouth daily.  . Vitamin D, Ergocalciferol, (DRISDOL) 50000 units CAPS capsule TAKE 1 CAPSULE BY MOUTH ONCE A WEEK ORALLY    Allergies  Allergen Reactions  . Penicillins     Social History   Tobacco Use  . Smoking status: Former Games developer  . Smokeless tobacco: Never Used  Substance Use Topics  . Alcohol use: Yes    Comment: wine  . Drug use: No   Social History   Social History Narrative   She is a married mother of 2.   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   She exercises about 3 days a week walking about 10 miles a week total.  She also enjoys yoga and routine hiking.  (Dedicated walking 60 minutes 2 days a week)   1-2 glasses of alcohol/wine per week.   Caffeine 2-1/2 cups daily.   Non-smoker but history of exposure to passive smoke.    family history is not on file.  Wt Readings from Last 3 Encounters:  12/25/17 148 lb (67.1 kg)  11/21/17 144 lb (65.3 kg)  09/15/16 145 lb (65.8 kg)    PHYSICAL EXAM BP 137/74   Pulse (!) 53   Ht 5\' 4"  (1.626 m)   Wt 148 lb (67.1 kg)   SpO2 99%   BMI 25.40 kg/m  Physical Exam  Constitutional: She is oriented to person, place, and time. She appears well-developed and well-nourished. No distress.  Healthy-appearing.  Well-groomed  HENT:  Head: Normocephalic and atraumatic.  Neck: No hepatojugular reflux and no JVD present. Carotid bruit is not  present. No thyromegaly present.  Cardiovascular: Normal rate, regular rhythm, normal heart sounds and intact distal pulses.  No extrasystoles are present. PMI is not displaced. Exam reveals no gallop and no friction rub.  No murmur heard. Pulmonary/Chest: Effort normal and breath sounds normal. No respiratory distress. She has no wheezes. She has no rales.  Musculoskeletal: Normal range of motion. She exhibits no edema.  Neurological: She is alert and oriented to person, place, and time. No cranial nerve deficit.  Skin: Skin is warm and dry. No rash noted. No erythema.  Psychiatric: She has a normal mood and affect. Her behavior is normal. Judgment and thought content normal.     Adult ECG Report No EKG   Other studies Reviewed: Additional studies/ records that were reviewed today include:   No new labs   This patients CHA2DS2-VASc Score and unadjusted Ischemic Stroke Rate (% per year) is equal to 2.2 % stroke rate/year  from a score of 2    Above score calculated as 1 point each if present [CHF, HTN, DM, Vascular=MI/PAD/Aortic Plaque, Age if 65-74, or Female] Above score calculated as 2 points each if present [Age > 75, or Stroke/TIA/TE] Just below the Kathlene November you do not wait on Monday, and offset the okay  ASSESSMENT / PLAN:   Problem List Items Addressed This Visit    DOE (dyspnea on exertion)    Think this may be related to deconditioning, and it could be potentially related to chronotropic incompetence.  I would like to exclude an ischemic etiology (of the discomfort/dyspnea and A. fib) with an Exercise Myoview      Relevant Orders   MYOCARDIAL PERFUSION IMAGING   PAF (paroxysmal atrial fibrillation) (HCC) - Primary (Chronic)    Reviewed monitor with colleagues will be all agreed that this was probably more consistent with paroxysmal atrial fibrillation. Rate seems to be much better controlled with diltiazem, and has had less frequent episodes. Plan:   Continue  diltiazem.  Given presence of exertional dyspnea with some discomfort in the chest we will check a Myoview stress test to exclude ischemic etiology.  CHA2DS2-VASc score 2 (age and female) -> discussed risks, benefits of anticoagulation versus simply continue aspirin.  Low HAS BLED score. ->  She is definitely fearful of stroke and what is peripherally happy with starting Eliquis 5 mg twice daily.      Relevant Medications   apixaban (ELIQUIS) 5 MG TABS tablet      Current medicines are reviewed at length with the patient today. (+/- concerns) n/a The following changes have been made: see below  Patient Instructions  MEDICATION INSTRUCTIONS   CONTINUE CURRENT MEDICATION  START ELIQUIS 5 MG ONE TABLET DAILY    TESTS SCHEDULE AT [address]  THE DAY OF TEST DO NOT TAKE  DILTIAZEM UNTIL AFTER TEST Your physician has requested that you have en exercise stress myoview. For further information please visit [email or web address] Please follow instruction sheet, as given.     Your physician recommends that you schedule a follow-up appointment in 1 MONTH WITH DR HARDING.   If you need a refill on your cardiac medications before your next appointment, please call your pharmacy.    Studies Ordered:   Orders Placed This Encounter  Procedures  . MYOCARDIAL PERFUSION IMAGING      [name], M.D., M.S. Interventional Cardiologist   Pager # [phone] Phone # [phone] [address] [place], [place] [address]   Thank you for choosing [facility]!!

## 2017-12-25 NOTE — Patient Instructions (Signed)
MEDICATION INSTRUCTIONS   CONTINUE CURRENT MEDICATION  START ELIQUIS 5 MG ONE TABLET DAILY    TESTS SCHEDULE AT [address]  THE DAY OF TEST DO NOT TAKE  DILTIAZEM UNTIL AFTER TEST Your physician has requested that you have en exercise stress myoview. For further information please visit [email or web address] Please follow instruction sheet, as given.     Your physician recommends that you schedule a follow-up appointment in 1 MONTH WITH DR HARDING.   If you need a refill on your cardiac medications before your next appointment, please call your pharmacy.

## 2017-12-26 ENCOUNTER — Telehealth (HOSPITAL_COMMUNITY): Payer: Self-pay

## 2017-12-26 NOTE — Telephone Encounter (Signed)
Encounter complete. 

## 2017-12-27 ENCOUNTER — Encounter: Payer: Self-pay | Admitting: Cardiology

## 2017-12-27 NOTE — Assessment & Plan Note (Signed)
Think this may be related to deconditioning, and it could be potentially related to chronotropic incompetence.  I would like to exclude an ischemic etiology (of the discomfort/dyspnea and A. fib) with an Exercise Myoview

## 2017-12-27 NOTE — Assessment & Plan Note (Signed)
Reviewed monitor with colleagues will be all agreed that this was probably more consistent with paroxysmal atrial fibrillation. Rate seems to be much better controlled with diltiazem, and has had less frequent episodes. Plan:   Continue diltiazem.  Given presence of exertional dyspnea with some discomfort in the chest we will check a Myoview stress test to exclude ischemic etiology.  CHA2DS2-VASc score 2 (age and female) -> discussed risks, benefits of anticoagulation versus simply continue aspirin.  Low HAS BLED score. ->  She is definitely fearful of stroke and what is peripherally happy with starting Eliquis 5 mg twice daily.

## 2017-12-30 ENCOUNTER — Ambulatory Visit (HOSPITAL_COMMUNITY)
Admission: RE | Admit: 2017-12-30 | Discharge: 2017-12-30 | Disposition: A | Discharge status: Home / Self Care | Payer: Medicare Other | Source: Ambulatory Visit | Attending: Cardiovascular Disease | Admitting: Cardiovascular Disease

## 2017-12-30 DIAGNOSIS — R0609 Other forms of dyspnea: Secondary | ICD-10-CM | POA: Insufficient documentation

## 2017-12-30 LAB — MYOCARDIAL PERFUSION IMAGING
CHL CUP RESTING HR STRESS: 48 {beats}/min
CHL RATE OF PERCEIVED EXERTION: 18
CSEPED: 9 min
CSEPEW: 10.1 METS
Exercise duration (sec): 0 s
LV dias vol: 78 mL (ref 46–106)
LVSYSVOL: 25 mL
MPHR: 154 {beats}/min
Peak HR: 179 {beats}/min
Percent HR: 116 %
SDS: 2
SRS: 0
SSS: 2
TID: 0.96

## 2017-12-30 MED ORDER — TECHNETIUM TC 99M TETROFOSMIN IV KIT
10.2000 | PACK | Freq: Once | INTRAVENOUS | Status: AC | PRN
  Administered 2017-12-30: 10.2 via INTRAVENOUS
  Filled 2017-12-30: qty 11

## 2017-12-30 MED ORDER — TECHNETIUM TC 99M TETROFOSMIN IV KIT
31.6000 | PACK | Freq: Once | INTRAVENOUS | Status: AC | PRN
  Administered 2017-12-30: 31.6 via INTRAVENOUS
  Filled 2017-12-30: qty 32

## 2018-01-02 ENCOUNTER — Encounter: Payer: Self-pay | Admitting: Cardiology

## 2018-01-05 NOTE — Telephone Encounter (Signed)
Spoke to patient. She has notice episodes more frequent , and will be out of town next week . Patient is concerned of what to do .  Appointment schedule for 11 am 01/06/18 what Nicole carroll np  Instruction given to patient how to get heart and vascular -afib clinic

## 2018-01-06 ENCOUNTER — Encounter (HOSPITAL_COMMUNITY): Payer: Self-pay | Admitting: Nurse Practitioner

## 2018-01-06 ENCOUNTER — Ambulatory Visit (HOSPITAL_COMMUNITY)
Admission: RE | Admit: 2018-01-06 | Discharge: 2018-01-06 | Disposition: A | Discharge status: Home / Self Care | Payer: Medicare Other | Source: Ambulatory Visit | Attending: Nurse Practitioner | Admitting: Nurse Practitioner

## 2018-01-06 VITALS — BP 126/82 | HR 61 | Ht 64.0 in | Wt 148.0 lb

## 2018-01-06 DIAGNOSIS — Z87891 Personal history of nicotine dependence: Secondary | ICD-10-CM | POA: Diagnosis not present

## 2018-01-06 DIAGNOSIS — E785 Hyperlipidemia, unspecified: Secondary | ICD-10-CM | POA: Insufficient documentation

## 2018-01-06 DIAGNOSIS — I480 Paroxysmal atrial fibrillation: Secondary | ICD-10-CM | POA: Insufficient documentation

## 2018-01-06 DIAGNOSIS — Z79890 Hormone replacement therapy (postmenopausal): Secondary | ICD-10-CM | POA: Diagnosis not present

## 2018-01-06 DIAGNOSIS — Z880 Allergy status to penicillin: Secondary | ICD-10-CM | POA: Diagnosis not present

## 2018-01-06 DIAGNOSIS — Z79899 Other long term (current) drug therapy: Secondary | ICD-10-CM | POA: Diagnosis not present

## 2018-01-06 DIAGNOSIS — E039 Hypothyroidism, unspecified: Secondary | ICD-10-CM | POA: Diagnosis not present

## 2018-01-06 DIAGNOSIS — I4891 Unspecified atrial fibrillation: Secondary | ICD-10-CM | POA: Diagnosis present

## 2018-01-06 DIAGNOSIS — E0500 Thyrotoxicosis with diffuse goiter without thyrotoxic crisis or storm: Secondary | ICD-10-CM | POA: Insufficient documentation

## 2018-01-06 DIAGNOSIS — Z7901 Long term (current) use of anticoagulants: Secondary | ICD-10-CM | POA: Insufficient documentation

## 2018-01-06 MED ORDER — FLECAINIDE ACETATE 50 MG PO TABS: 50.0000 mg | ORAL_TABLET | Freq: Two times a day (BID) | ORAL | 3 refills | Status: DC

## 2018-01-06 NOTE — Patient Instructions (Signed)
Stop Aspirin Stop Tumeric Start Flecainide 50mg  twice a day

## 2018-01-06 NOTE — Progress Notes (Signed)
Primary Care Physician: Maurice Small, MD Referring Physician:Harding    Nicole Haynes is a 67 y.o. female with a h/o paroxysmal afib recently diagnosed on event monitor . She was started on diltiazem and eliquis 5 mg bid. She initially felt some decrease in her afib burden when starting her diltiazem, but recently has noted more afib, everyday for the last week for at least 15-30 mins. She is here for further suggestions to control afib episodes. She recently had a stress test which was low risk and echo without any significant LVH. We discussed use of flecainde and she would like to try. She will be traveling out of town nest week so she is anxious to have afib  better controlled.  Today, she denies symptoms of palpitations, chest pain, shortness of breath, orthopnea, PND, lower extremity edema, dizziness, presyncope, syncope, or neurologic sequela. The patient is tolerating medications without difficulties and is otherwise without complaint today.   Past Medical History:  Diagnosis Date  . Dyslipidemia    On rosuvastatin, Krill oral  . Hypothyroidism due to Graves' disease status post radiation iodide    On levothyroxine   Past Surgical History:  Procedure Laterality Date  . CARDIAC EVENT MONITOR  11/2017   30 monitor: 98% normal sinus rhythm.  2% PAF with rates as high as 160 bpm.  . None    . TRANSTHORACIC ECHOCARDIOGRAM  11/28/2017   Normal LV size and function.  EF 60-65%.  Wall motion.  Mild AI, Mild-mod TR with mildly elevated PA pressures.    Current Outpatient Medications  Medication Sig Dispense Refill  . apixaban (ELIQUIS) 5 MG TABS tablet Take 1 tablet (5 mg total) by mouth 2 (two) times daily. 60 tablet 11  . diltiazem (CARDIZEM CD) 120 MG 24 hr capsule Take 1 capsule (120 mg total) by mouth daily. 30 capsule 6  . Krill Oil 500 MG CAPS Take 500 mg by mouth daily.    Marland Kitchen levothyroxine (SYNTHROID, LEVOTHROID) 100 MCG tablet Take 100 mcg by mouth daily before  breakfast.     . Magnesium 250 MG TABS Take 250 mg by mouth daily.    . Riboflavin (B2) 100 MG TABS Take by mouth.    . rosuvastatin (CRESTOR) 5 MG tablet Take 5 mg by mouth daily.  1  . Vitamin D, Ergocalciferol, (DRISDOL) 50000 units CAPS capsule TAKE 1 CAPSULE BY MOUTH ONCE A WEEK ORALLY  0  . flecainide (TAMBOCOR) 50 MG tablet Take 1 tablet (50 mg total) by mouth 2 (two) times daily. 60 tablet 3   No current facility-administered medications for this encounter.     Allergies  Allergen Reactions  . Penicillins     Social History   Socioeconomic History  . Marital status: Married    Spouse name: Not on file  . Number of children: 2  . Years of education: Not on file  . Highest education level: Not on file  Social Needs  . Financial resource strain: Not on file  . Food insecurity - worry: Not on file  . Food insecurity - inability: Not on file  . Transportation needs - medical: Not on file  . Transportation needs - non-medical: Not on file  Occupational History  . Occupation: Runner, broadcasting/film/video    Comment: Retired  Tobacco Use  . Smoking status: Former Games developer  . Smokeless tobacco: Never Used  Substance and Sexual Activity  . Alcohol use: Yes    Comment: wine  . Drug use: No  .  Sexual activity: Not on file  Other Topics Concern  . Not on file  Social History Narrative   She is a married mother of 2.   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   She exercises about 3 days a week walking about 10 miles a week total.  She also enjoys yoga and routine hiking.  (Dedicated walking 60 minutes 2 days a week)   1-2 glasses of alcohol/wine per week.   Caffeine 2-1/2 cups daily.   Non-smoker but history of exposure to passive smoke.    Family History  Problem Relation Age of Onset  . Breast cancer Neg Hx     ROS- All systems are reviewed and negative except as per the HPI above  Physical Exam: Vitals:   01/06/18 1108  BP: 126/82  Pulse: 61  Weight: 148 lb (67.1  kg)  Height: 5\' 4"  (1.626 m)   Wt Readings from Last 3 Encounters:  01/06/18 148 lb (67.1 kg)  12/30/17 148 lb (67.1 kg)  12/25/17 148 lb (67.1 kg)    Labs: Lab Results  Component Value Date   NA 141 09/15/2016   K 3.4 (L) 09/15/2016   CL 105 09/15/2016   CO2 25 09/15/2016   GLUCOSE 98 09/15/2016   BUN 18 09/15/2016   CREATININE 0.99 09/15/2016   CALCIUM 9.4 09/15/2016   No results found for: INR No results found for: CHOL, HDL, LDLCALC, TRIG   GEN- The patient is well appearing, alert and oriented x 3 today.   Head- normocephalic, atraumatic Eyes-  Sclera clear, conjunctiva pink Ears- hearing intact Oropharynx- clear Neck- supple, no JVP Lymph- no cervical lymphadenopathy Lungs- Clear to ausculation bilaterally, normal work of breathing Heart- Regular rate and rhythm, no murmurs, rubs or gallops, PMI not laterally displaced GI- soft, NT, ND, + BS Extremities- no clubbing, cyanosis, or edema MS- no significant deformity or atrophy Skin- no rash or lesion Psych- euthymic mood, full affect Neuro- strength and sensation are intact  EKG-NSR at 61 bpm, PR int 144 ms, qrs int 72 ms, qtc 455 ms Epic records reviewed Echo-Study Conclusions  - Left ventricle: The cavity size was normal. Systolic function was   normal. The estimated ejection fraction was in the range of 60%   to 65%. Wall motion was normal; there were no regional wall   motion abnormalities. Left ventricular diastolic function   parameters were normal. - Aortic valve: There was mild regurgitation. - Atrial septum: No defect or patent foramen ovale was identified. - Tricuspid valve: There was mild-moderate regurgitation directed   centrally. - Pulmonary arteries: PA peak pressure: 31 mm Hg (S).  Stress test:Study Highlights    The left ventricular ejection fraction is hyperdynamic (>65%).  Nuclear stress EF: 68%.  The patient walked for a total of 9 minutes into a standard Bruce protocol treadmill  test. Her peak heart rate was 179 which is 116% predicted maximal heart rate. Her blood pressure response to exercise was normal.  There were no ST or T wave changes to suggest ischemia.  The study is normal.  This is a low risk study.          Assessment and Plan: 1. Paroxysmal symptomatic afib General education re afib and triggers reviewed Continue diltiazem Discussed antiarrythmic's and will start flecainde 50 mg bid to help lower afib burden She will return on Thursday pm on flecainide for EKG f/u Continue eliquis 5 mg bid, can stop asa and tumeric as it can  cause additional bleeding risk  If tolerates, she will need f/u POET on flecainide as she is very active She has f/u with Dr. Herbie BaltimoreHarding and can see about getting this scheduled at this appointment as she will be out of town a lot over the next 2-3 weeks   afib clinic as needed  Lupita LeashDonna C. Matthew Folksarroll, ANP-C [facility] [address] [place], [place] [address] [phone]

## 2018-01-08 ENCOUNTER — Ambulatory Visit (HOSPITAL_COMMUNITY)
Admission: RE | Admit: 2018-01-08 | Discharge: 2018-01-08 | Disposition: A | Discharge status: Home / Self Care | Payer: Medicare Other | Source: Ambulatory Visit | Attending: Nurse Practitioner | Admitting: Nurse Practitioner

## 2018-01-08 DIAGNOSIS — I517 Cardiomegaly: Secondary | ICD-10-CM | POA: Diagnosis not present

## 2018-01-08 DIAGNOSIS — R9431 Abnormal electrocardiogram [ECG] [EKG]: Secondary | ICD-10-CM | POA: Diagnosis not present

## 2018-01-08 DIAGNOSIS — R001 Bradycardia, unspecified: Secondary | ICD-10-CM | POA: Diagnosis not present

## 2018-01-08 DIAGNOSIS — I4891 Unspecified atrial fibrillation: Secondary | ICD-10-CM | POA: Diagnosis present

## 2018-01-08 NOTE — Progress Notes (Signed)
Pt in for EKG with start of flecainide 50 mg bid. She feels well, has not noted any afib since starting. SR at 58 bpm, pr int 156 ms, qrs at 80 ms, qtc 449 ms. Has f/u with Dr. Herbie BaltimoreHarding 4/17.

## 2018-01-08 NOTE — Progress Notes (Signed)
Pt in for repeat EKG after starting Flecainide 50 mg bid Tuesday 3/19. To be reviewed by Rudi Cocoonna Carroll, NP

## 2018-02-02 ENCOUNTER — Telehealth: Payer: Self-pay | Admitting: Cardiology

## 2018-02-02 NOTE — Telephone Encounter (Signed)
Left message to call back  

## 2018-02-02 NOTE — Telephone Encounter (Signed)
New Message ° ° °Pt returning call °

## 2018-02-02 NOTE — Telephone Encounter (Signed)
Patient calling, states that she received a voicemail stating that her "levels were higher from January." Patient states that this message was left last week but she has been out of town and would like to discuss this.

## 2018-02-02 NOTE — Telephone Encounter (Signed)
Returned the call to the patient. She stated that she received a message that her "levels were up and she needed to change a medication." Patient has been informed that there is not a note in Epic for her results pertaining to this. Message will be routed to the provider's nurse for her recommendations.

## 2018-02-04 ENCOUNTER — Ambulatory Visit: Payer: Medicare Other | Admitting: Cardiology

## 2018-02-04 ENCOUNTER — Encounter: Payer: Self-pay | Admitting: Cardiology

## 2018-02-04 DIAGNOSIS — R0609 Other forms of dyspnea: Secondary | ICD-10-CM

## 2018-02-04 DIAGNOSIS — I480 Paroxysmal atrial fibrillation: Secondary | ICD-10-CM | POA: Diagnosis not present

## 2018-02-04 NOTE — Patient Instructions (Addendum)
MEDICATION  INSTRUCTION  CONTINUE WITH CURRENT MEDICATIONS  FLECAINIDE 50 MG TWICE A DAY ,BUT IF YOU HAVE A BREAKTHROUGH OF AFIB  TAKE 100 MG AT THAT TIME IF RHYTHM BREAKS GO BACK TO 50 MG , IF NOT IN 6 HOUR MAY A TAKE ANOTHER 100 MG  (MAXIUM DOSE OF 200 MG IN A DAY)  Your physician wants you to follow-up in 6 MONTH WITH DR HARDING. You will receive a reminder letter in the mail two months in advance. If you don't receive a letter, please call our office to schedule the follow-up appointment.    If you need a refill on your cardiac medications before your next appointment, please call your pharmacy.

## 2018-02-04 NOTE — Progress Notes (Signed)
PCP: Maurice Small, MD  Clinic Note: Chief Complaint  Patient presents with  . Follow-up    1 MONTH;Pt states no Sx.   . Atrial Fibrillation    Now on flecainide    HPI: Nicole Haynes is a 67 y.o. female paroxysmal atrial fibrillation noted on cardiac event monitor.  She was initially seen on February first at the request of Maurice Small, MD.  TRANAE LARAMIE was last seen on 3/7 --> she indicated that she felt much better having started diltiazem.  Recent Hospitalizations: None  Studies Personally Reviewed - (if available, images/films reviewed: From Epic Chart or Care Everywhere)  Myoview December 30, 2017: LOW RISK.  Walk for 9 minutes.  Peak heart rate 179 (116% of MPHR).  No EKG changes to suggest ischemia.  No ischemia or infarction noted on Myoview.  EF 65-70%.  Interval History: Galia presents here today after having been seen in the A. fib clinic on March 19.  She initially felt her A. fib burden went down after starting diltiazem but then noted more frequent episodes lasting 15-30 minutes.  They decided to try flecainide.  (50 mg twice daily). After initially noticing significant improvement with calcium channel blocker, now she notes that she has not even had any further A. fib episodes at all with flecainide.  She is not having any GI symptoms of nausea or vomiting.  She no longer feels anxiety of having the palpitations.  She is doing well on Eliquis without any bleeding issues.  Since she is not having the A. fib episodes anymore, she is not noticing any more chest discomfort with rest or exertion.  No rapid irregular heartbeats or palpitations anymore.  No syncope or near syncope, no TIA or amaurosis fugax.  PND, orthopnea or edema. No bleeding issues: No melena, hematochezia, hematuria, epistaxis or easy bruising.   No claudication.   ROS: A comprehensive was performed. Review of Systems  Constitutional: Negative for malaise/fatigue and weight loss.  HENT:  Negative for congestion and nosebleeds.   Respiratory: Negative for cough, shortness of breath and wheezing.   Cardiovascular: Negative for claudication.  Gastrointestinal: Negative for abdominal pain, constipation, diarrhea and heartburn.  Genitourinary: Negative for dysuria.  Musculoskeletal: Negative for back pain, joint pain and myalgias.  Neurological: Negative for dizziness (Notably improved), focal weakness, seizures and loss of consciousness.  Endo/Heme/Allergies: Does not bruise/bleed easily.  Psychiatric/Behavioral: The patient is nervous/anxious (Not as nervous, she is not having any more episodes.).   All other systems reviewed and are negative.   I have reviewed and (if needed) personally updated the patient's problem list, medications, allergies, past medical and surgical history, social and family history.   Past Medical History:  Diagnosis Date  . Dyslipidemia    On rosuvastatin, Krill oral  . Hypothyroidism due to Graves' disease status post radiation iodide    On levothyroxine    Past Surgical History:  Procedure Laterality Date  . CARDIAC EVENT MONITOR  11/2017   30 monitor: 98% normal sinus rhythm.  2% PAF with rates as high as 160 bpm.  . NM MYOVIEW LTD  12/2017   LOW RISK.  Walk for 9 minutes.  Peak heart rate 179 (116% of MPHR).  No EKG changes to suggest ischemia.  No ischemia or infarction noted on Myoview.  EF 65-70%.  . None    . TRANSTHORACIC ECHOCARDIOGRAM  11/28/2017   Normal LV size and function.  EF 60-65%.  Wall motion.  Mild AI, Mild-mod TR  with mildly elevated PA pressures.    Current Meds  Medication Sig  . apixaban (ELIQUIS) 5 MG TABS tablet Take 1 tablet (5 mg total) by mouth 2 (two) times daily.  . Calcium Carb-Cholecalciferol (CALCIUM 500+D3) 500-400 MG-UNIT TABS Take 400 mg by mouth 2 (two) times daily.  Marland Kitchen. diltiazem (CARDIZEM CD) 120 MG 24 hr capsule Take 1 capsule (120 mg total) by mouth daily.  . flecainide (TAMBOCOR) 50 MG tablet Take  1 tablet (50 mg total) by mouth 2 (two) times daily.  Boris Lown. Krill Oil 500 MG CAPS Take 500 mg by mouth daily.  Marland Kitchen. levothyroxine (SYNTHROID, LEVOTHROID) 100 MCG tablet Take 100 mcg by mouth daily before breakfast.   . Magnesium 250 MG TABS Take 250 mg by mouth daily.  . Riboflavin (B2) 100 MG TABS Take by mouth.  . rosuvastatin (CRESTOR) 5 MG tablet Take 5 mg by mouth daily.  . Vitamin D, Ergocalciferol, (DRISDOL) 50000 units CAPS capsule TAKE 1 CAPSULE BY MOUTH ONCE A WEEK ORALLY    Allergies  Allergen Reactions  . Penicillins     Social History   Tobacco Use  . Smoking status: Former Games developermoker  . Smokeless tobacco: Never Used  Substance Use Topics  . Alcohol use: Yes    Comment: wine  . Drug use: No   Social History   Social History Narrative   She is a married mother of 2.   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   She exercises about 3 days a week walking about 10 miles a week total.  She also enjoys yoga and routine hiking.  (Dedicated walking 60 minutes 2 days a week)   1-2 glasses of alcohol/wine per week.   Caffeine 2-1/2 cups daily.   Non-smoker but history of exposure to passive smoke.    family history is not on file.  No family history of premature CAD or heart failure.  Wt Readings from Last 3 Encounters:  02/04/18 145 lb (65.8 kg)  01/06/18 148 lb (67.1 kg)  12/30/17 148 lb (67.1 kg)    PHYSICAL EXAM BP 115/65   Pulse 66   Ht 5\' 4"  (1.626 m)   Wt 145 lb (65.8 kg)   BMI 24.89 kg/m  Physical Exam  Constitutional: She is oriented to person, place, and time. She appears well-developed and well-nourished. No distress.  Healthy-appearing.  Well-groomed  HENT:  Head: Normocephalic and atraumatic.  Neck: No hepatojugular reflux and no JVD present. Carotid bruit is not present. No thyromegaly present.  Cardiovascular: Normal rate, regular rhythm, normal heart sounds and intact distal pulses.  No extrasystoles are present. PMI is not displaced. Exam  reveals no gallop and no friction rub.  No murmur heard. Pulmonary/Chest: Effort normal and breath sounds normal. No respiratory distress. She has no wheezes. She has no rales.  Musculoskeletal: Normal range of motion. She exhibits no edema.  Neurological: She is alert and oriented to person, place, and time. No cranial nerve deficit.  Skin: Skin is warm and dry. No rash noted. No erythema.  Psychiatric: She has a normal mood and affect. Her behavior is normal. Judgment and thought content normal.     Adult ECG Report No EKG   Other studies Reviewed: Additional studies/ records that were reviewed today include:   No new labs   This patients CHA2DS2-VASc Score and unadjusted Ischemic Stroke Rate (% per year) is equal to 2.2 % stroke rate/year from a score of 2  Above score calculated as  1 point each if present [CHF, HTN, DM, Vascular=MI/PAD/Aortic Plaque, Age if 28-74, or Female]; 2 points each if present [Age > 75, or Stroke/TIA/TE]  ASSESSMENT / PLAN:   Problem List Items Addressed This Visit    PAF (paroxysmal atrial fibrillation) (HCC) (Chronic)    No further episodes since starting 39.  For now.  I think the best option would continue with low-dose flecainide twice daily and standing dose of diltiazem. We talked about as needed use of higher dose flecainide for breakthrough.  FLECAINIDE 50 MG TWICE A DAY ,BUT IF YOU HAVE A BREAKTHROUGH OF AFIB  TAKE 100 MG AT THAT TIME IF RHYTHM BREAKS GO BACK TO 50 MG , IF NOT IN 6 HOUR MAY A TAKE ANOTHER 100 MG  (MAXIUM DOSE OF 200 MG IN A DAY)  Continue Eliquis for anticoagulation.      DOE (dyspnea on exertion) (Chronic)    Relative normal echocardiogram and negative nonischemic Myoview.         Current medicines are reviewed at length with the patient today. (+/- concerns) n/a The following changes have been made: see below  Patient Instructions  MEDICATION  INSTRUCTION  CONTINUE WITH CURRENT MEDICATIONS  FLECAINIDE 50 MG  TWICE A DAY ,BUT IF YOU HAVE A BREAKTHROUGH OF AFIB  TAKE 100 MG AT THAT TIME IF RHYTHM BREAKS GO BACK TO 50 MG , IF NOT IN 6 HOUR MAY A TAKE ANOTHER 100 MG  (MAXIUM DOSE OF 200 MG IN A DAY)  Your physician wants you to follow-up in 6 MONTH WITH DR HARDING. You will receive a reminder letter in the mail two months in advance. If you don't receive a letter, please call our office to schedule the follow-up appointment.    If you need a refill on your cardiac medications before your next appointment, please call your pharmacy.    Studies Ordered:   No orders of the defined types were placed in this encounter.     [name], M.D., M.S. Interventional Cardiologist   Pager # [phone] Phone # [phone] [address] [place], [place] [address]   Thank you for choosing Heartcare at [facility]!!

## 2018-02-06 ENCOUNTER — Encounter: Payer: Self-pay | Admitting: Cardiology

## 2018-02-06 NOTE — Assessment & Plan Note (Signed)
Relative normal echocardiogram and negative nonischemic Myoview.

## 2018-02-06 NOTE — Assessment & Plan Note (Signed)
No further episodes since starting 39.  For now.  I think the best option would continue with low-dose flecainide twice daily and standing dose of diltiazem. We talked about as needed use of higher dose flecainide for breakthrough.  FLECAINIDE 50 MG TWICE A DAY ,BUT IF YOU HAVE A BREAKTHROUGH OF AFIB  TAKE 100 MG AT THAT TIME IF RHYTHM BREAKS GO BACK TO 50 MG , IF NOT IN 6 HOUR MAY A TAKE ANOTHER 100 MG  (MAXIUM DOSE OF 200 MG IN A DAY)  Continue Eliquis for anticoagulation.

## 2018-03-29 ENCOUNTER — Other Ambulatory Visit (HOSPITAL_COMMUNITY): Payer: Self-pay | Admitting: Nurse Practitioner

## 2018-06-17 ENCOUNTER — Other Ambulatory Visit: Payer: Self-pay | Admitting: Cardiology

## 2018-08-04 ENCOUNTER — Encounter: Payer: Self-pay | Admitting: Cardiology

## 2018-08-04 ENCOUNTER — Ambulatory Visit: Payer: Medicare Other | Admitting: Cardiology

## 2018-08-04 VITALS — BP 114/66 | HR 62 | Ht 64.0 in | Wt 149.4 lb

## 2018-08-04 DIAGNOSIS — R0609 Other forms of dyspnea: Secondary | ICD-10-CM

## 2018-08-04 DIAGNOSIS — I480 Paroxysmal atrial fibrillation: Secondary | ICD-10-CM

## 2018-08-04 NOTE — Progress Notes (Signed)
PCP: Maurice Small, MD  Clinic Note: No chief complaint on file.   HPI: Nicole Haynes is a 67 y.o. female w/ h/o PAF noted on cardiac event monitor presenting for 6 month f/u  She was initially seen on February first at the request of Maurice Small, MD.  SALAH BURLISON was last seen on 02/04/18 --> she indicated that she felt much better having started diltiazem.  She was seen in the A. fib clinic and started on Flecainide 50 mg BID & plan for increased dose for breakthrough.  Recent Hospitalizations: None  Studies Personally Reviewed - (if available, images/films reviewed: From Epic Chart or Care Everywhere)  No new studies  Interval History: Nicole Haynes presents here today for follow-up feeling quite well.  She denies having any further episodes of A. fib.  Not have any palpitations.  She is not having any chest tightness pressure rest or exertion.  All she notes is that when she first starts exercising or walking it takes her a little bit of time to get her energy.  When she gets going after the first few minutes she does fine and is able to continue to go on without any dyspnea.  Nothing limiting. She has no sensation of fatigue besides that initial step of exercising.  Otherwise no symptoms.  No syncope/near syncope or TIA/amaurosis fugax.  No PND, orthopnea or edema.  No bleeding issues.  No claudication.  Review of Systems  Constitutional: Negative for malaise/fatigue (Takes a little bit of effort and shortness of breath when first starting to walk, but improves with she gets going.) and weight loss.  HENT: Negative for congestion and nosebleeds.   Respiratory: Negative for cough and shortness of breath.   Gastrointestinal: Negative for blood in stool, heartburn and melena.  Genitourinary: Negative for hematuria.  Musculoskeletal: Negative for joint pain.  Neurological: Negative for dizziness and headaches.  Endo/Heme/Allergies: Does not bruise/bleed easily.    Psychiatric/Behavioral: The patient is not nervous/anxious and does not have insomnia.     I have reviewed and (if needed) personally updated the patient's problem list, medications, allergies, past medical and surgical history, social and family history.   Past Medical History:  Diagnosis Date  . Dyslipidemia    On rosuvastatin, Krill oral  . Hypothyroidism due to Graves' disease status post radiation iodide    On levothyroxine  . Paroxysmal atrial fibrillation (HCC)    Rate controlled with diltiazem, rhythm control with flecainide. CHA2DS2Vasc = 2; on Eliquis    Past Surgical History:  Procedure Laterality Date  . CARDIAC EVENT MONITOR  11/2017   30 monitor: 98% normal sinus rhythm.  2% PAF with rates as high as 160 bpm.  . NM MYOVIEW LTD  12/2017   LOW RISK.  Walk for 9 minutes.  Peak heart rate 179 (116% of MPHR).  No EKG changes to suggest ischemia.  No ischemia or infarction noted on Myoview.  EF 65-70%.  . None    . TRANSTHORACIC ECHOCARDIOGRAM  11/28/2017   Normal LV size and function.  EF 60-65%.  Wall motion.  Mild AI, Mild-mod TR with mildly elevated PA pressures.    Current Meds  Medication Sig  . apixaban (ELIQUIS) 5 MG TABS tablet Take 1 tablet (5 mg total) by mouth 2 (two) times daily.  . Calcium Carb-Cholecalciferol (CALCIUM 500+D3) 500-400 MG-UNIT TABS Take 400 mg by mouth 2 (two) times daily.  Marland Kitchen diltiazem (CARDIZEM CD) 120 MG 24 hr capsule Take 1 capsule (120 mg total)  by mouth daily.  . flecainide (TAMBOCOR) 50 MG tablet TAKE 1 TABLET BY MOUTH TWICE A DAY  . levothyroxine (SYNTHROID, LEVOTHROID) 100 MCG tablet Take 100 mcg by mouth daily before breakfast.   . Magnesium 250 MG TABS Take 250 mg by mouth daily.  . Riboflavin (B2) 100 MG TABS Take by mouth.  . rosuvastatin (CRESTOR) 5 MG tablet Take 5 mg by mouth daily.  Marland Kitchen VITAMIN D, CHOLECALCIFEROL, PO Take by mouth daily.    Allergies  Allergen Reactions  . Penicillins     Social History   Tobacco Use   . Smoking status: Former Games developer  . Smokeless tobacco: Never Used  Substance Use Topics  . Alcohol use: Yes    Comment: wine  . Drug use: No   Social History   Social History Narrative   She is a married mother of 2.   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   She exercises about 3 days a week walking about 10 miles a week total.  She also enjoys yoga and routine hiking.  (Dedicated walking 60 minutes 2 days a week)   1-2 glasses of alcohol/wine per week.   Caffeine 2-1/2 cups daily.   Non-smoker but history of exposure to passive smoke.    family history is not on file.  No family history of premature CAD or heart failure.  Wt Readings from Last 3 Encounters:  08/04/18 149 lb 6.4 oz (67.8 kg)  02/04/18 145 lb (65.8 kg)  01/06/18 148 lb (67.1 kg)    PHYSICAL EXAM BP 114/66   Pulse 62   Ht 5\' 4"  (1.626 m)   Wt 149 lb 6.4 oz (67.8 kg)   BMI 25.64 kg/m  Physical Exam  Constitutional: She is oriented to person, place, and time. She appears well-developed and well-nourished. No distress.  Healthy-appearing.  Well-groomed  HENT:  Head: Normocephalic and atraumatic.  Neck: Normal range of motion. Neck supple. No hepatojugular reflux and no JVD present. Carotid bruit is not present.  Cardiovascular: Normal rate, regular rhythm, normal heart sounds and intact distal pulses.  No extrasystoles are present. PMI is not displaced. Exam reveals no gallop and no friction rub.  No murmur heard. Pulmonary/Chest: Effort normal and breath sounds normal. No respiratory distress. She has no wheezes. She has no rales.  Musculoskeletal: Normal range of motion. She exhibits no edema.  Neurological: She is alert and oriented to person, place, and time.  Psychiatric: She has a normal mood and affect. Her behavior is normal. Judgment and thought content normal.  Vitals reviewed.    Adult ECG Report No EKG  Other studies Reviewed: Additional studies/ records that were reviewed  today include:   No new labs   This patients CHA2DS2-VASc Score and unadjusted Ischemic Stroke Rate (% per year) is equal to 2.2 % stroke rate/year from a score of 2  Above score calculated as 1 point each if present [CHF, HTN, DM, Vascular=MI/PAD/Aortic Plaque, Age if 40-74, or Female]; 2 points each if present [Age > 75, or Stroke/TIA/TE]  ASSESSMENT / PLAN:   Problem List Items Addressed This Visit    DOE (dyspnea on exertion) (Chronic)    Normal echo and normal Myoview.  I think that her exertional dyspnea now is just overcoming the calcium channel blocker and flecainide in order to get her heart rate up in sinus rhythm.  Once she gets going, she seems to be doing well. Continue to encourage staying active and exercising.  PAF (paroxysmal atrial fibrillation) (HCC):  CHA2DS2-VASc Score 2 (Age 24, female) - Primary (Chronic)    No further breakthrough episodes.  Doing well with low-dose flecainide less diltiazem.  I think the earliest onset dyspnea and fatigue with walking that goes away when she gets going is related to overcoming the diltiazem and flecainide.  I am leery of backing off on the diltiazem dose any further.  It does not seem like her symptoms all that limiting and therefore would just continue current dosing. She is familiar with to do for breakthrough episodes.  Continue Eliquis.  She would prefer to be safe and protected and stroke.  No bleeding issues so far.  (Would be okay to stop Eliquis for procedures -Per preprocedure protocol).         Current medicines are reviewed at length with the patient today. (+/- concerns) n/a The following changes have been made: see below  Patient Instructions  Medication Instructions:  Not needed If you need a refill on your cardiac medications before your next appointment, please call your pharmacy.   Lab work: Not needed If you have labs (blood work) drawn today and your tests are completely normal, you will receive  your results only by: Marland Kitchen MyChart Message (if you have MyChart) OR . A paper copy in the mail If you have any lab test that is abnormal or we need to change your treatment, we will call you to review the results.  Testing/Procedures: Not needed  Follow-Up: At [facility], you and your health needs are our priority.  As part of our continuing mission to provide you with exceptional heart care, we have created designated Provider Care Teams.  These Care Teams include your primary Cardiologist (physician) and Advanced Practice Providers (APPs -  Physician Assistants and Nurse Practitioners) who all work together to provide you with the care you need, when you need it. You will need a follow up appointment in 6 months-April 2020.  Please call our office 2 months in advance to schedule this appointment.  You may see [name], MD or one of the following Advanced Practice Providers on your designated Care Team:   Theodore Demark, PA-C . Joni Reining, DNP, ANP  Any Other Special Instructions Will Be Listed Below (If Applicable).  MAY USE  ALEVE OR TYLENOL FOR ARTHRITIC PAIN - NOT FOR LONG PERIODS TIME     Studies Ordered:   No orders of the defined types were placed in this encounter.     [name], M.D., M.S. Interventional Cardiologist   Pager # [phone] Phone # [phone] [address] [place], [place] [address]   Thank you for choosing Heartcare at [facility]!!

## 2018-08-04 NOTE — Patient Instructions (Addendum)
Medication Instructions:  Not needed If you need a refill on your cardiac medications before your next appointment, please call your pharmacy.   Lab work: Not needed If you have labs (blood work) drawn today and your tests are completely normal, you will receive your results only by: Marland Kitchen MyChart Message (if you have MyChart) OR . A paper copy in the mail If you have any lab test that is abnormal or we need to change your treatment, we will call you to review the results.  Testing/Procedures: Not needed  Follow-Up: At [facility] you and your health needs are our priority.  As part of our continuing mission to provide you with exceptional heart care, we have created designated Provider Care Teams.  These Care Teams include your primary Cardiologist (physician) and Advanced Practice Providers (APPs -  Physician Assistants and Nurse Practitioners) who all work together to provide you with the care you need, when you need it. You will need a follow up appointment in 6 months-April 2020.  Please call our office 2 months in advance to schedule this appointment.  You may see [name], MD or one of the following Advanced Practice Providers on your designated Care Team:   Theodore Demark, PA-C . Joni Reining, DNP, ANP  Any Other Special Instructions Will Be Listed Below (If Applicable).  MAY USE  ALEVE OR TYLENOL FOR ARTHRITIC PAIN - NOT FOR LONG PERIODS TIME

## 2018-08-06 ENCOUNTER — Encounter: Payer: Self-pay | Admitting: Cardiology

## 2018-08-06 NOTE — Assessment & Plan Note (Signed)
Normal echo and normal Myoview.  I think that her exertional dyspnea now is just overcoming the calcium channel blocker and flecainide in order to get her heart rate up in sinus rhythm.  Once she gets going, she seems to be doing well. Continue to encourage staying active and exercising.

## 2018-08-06 NOTE — Assessment & Plan Note (Addendum)
No further breakthrough episodes.  Doing well with low-dose flecainide less diltiazem.  I think the earliest onset dyspnea and fatigue with walking that goes away when she gets going is related to overcoming the diltiazem and flecainide.  I am leery of backing off on the diltiazem dose any further.  It does not seem like her symptoms all that limiting and therefore would just continue current dosing. She is familiar with to do for breakthrough episodes.  Continue Eliquis.  She would prefer to be safe and protected and stroke.  No bleeding issues so far.  (Would be okay to stop Eliquis for procedures -Per preprocedure protocol).

## 2018-08-25 ENCOUNTER — Other Ambulatory Visit: Payer: Self-pay | Admitting: Physician Assistant

## 2018-08-25 ENCOUNTER — Ambulatory Visit
Admission: RE | Admit: 2018-08-25 | Discharge: 2018-08-25 | Disposition: A | Discharge status: Home / Self Care | Payer: Medicare Other | Source: Ambulatory Visit | Attending: Physician Assistant | Admitting: Physician Assistant

## 2018-08-25 DIAGNOSIS — R52 Pain, unspecified: Secondary | ICD-10-CM

## 2018-10-16 ENCOUNTER — Other Ambulatory Visit: Payer: Self-pay | Admitting: Family Medicine

## 2018-10-16 DIAGNOSIS — Z1231 Encounter for screening mammogram for malignant neoplasm of breast: Secondary | ICD-10-CM

## 2018-11-18 ENCOUNTER — Ambulatory Visit
Admission: RE | Admit: 2018-11-18 | Discharge: 2018-11-18 | Disposition: A | Discharge status: Home / Self Care | Payer: Medicare Other | Source: Ambulatory Visit | Attending: Family Medicine | Admitting: Family Medicine

## 2018-11-18 DIAGNOSIS — Z1231 Encounter for screening mammogram for malignant neoplasm of breast: Secondary | ICD-10-CM

## 2018-12-13 ENCOUNTER — Other Ambulatory Visit: Payer: Self-pay | Admitting: Cardiology

## 2018-12-26 ENCOUNTER — Other Ambulatory Visit (HOSPITAL_COMMUNITY): Payer: Self-pay | Admitting: Cardiology

## 2019-01-27 ENCOUNTER — Telehealth: Payer: Self-pay

## 2019-01-27 NOTE — Telephone Encounter (Signed)
Virtual Visit Pre-Appointment Phone Call  Steps For Call:  1. Confirm consent - "In the setting of the current Covid19 crisis, you are scheduled for a (phone or video) visit with your provider on (date) at (time).  Just as we do with many in-office visits, in order for you to participate in this visit, we must obtain consent.  If you'd like, I can send this to your mychart (if signed up) or email for you to review.  Otherwise, I can obtain your verbal consent now.  All virtual visits are billed to your insurance company just like a normal visit would be.  By agreeing to a virtual visit, we'd like you to understand that the technology does not allow for your provider to perform an examination, and thus may limit your provider's ability to fully assess your condition.  Finally, though the technology is pretty good, we cannot assure that it will always work on either your or our end, and in the setting of a video visit, we may have to convert it to a phone-only visit.  In either situation, we cannot ensure that we have a secure connection.  Are you willing to proceed?"  2. Give patient instructions for WebEx download to smartphone as below if video visit  3. Advise patient to be prepared with any vital sign or heart rhythm information, their current medicines, and a piece of paper and pen handy for any instructions they may receive the day of their visit  4. Inform patient they will receive a phone call 15 minutes prior to their appointment time (may be from unknown caller ID) so they should be prepared to answer  5. Confirm that appointment type is correct in Epic appointment notes (video vs telephone)    TELEPHONE CALL NOTE  Nicole Haynes has been deemed a candidate for a follow-up tele-health visit to limit community exposure during the Covid-19 pandemic. I spoke with the patient via phone to ensure availability of phone/video source, confirm preferred email & phone number, and discuss  instructions and expectations.  I reminded Nicole Haynes to be prepared with any vital sign and/or heart rhythm information that could potentially be obtained via home monitoring, at the time of her visit. I reminded Nicole Haynes to expect a phone call at the time of her visit if her visit.  Did the patient verbally acknowledge consent to treatment? YES  Dorris Fetch, CMA 01/27/2019 5:19 PM   DOWNLOADING THE WEBEX SOFTWARE TO SMARTPHONE  - If Apple, go to [employer] and type in WebEx in the search bar. Download [employer], the blue/green circle. The app is free but as with any other app downloads, their phone may require them to verify saved payment information or Apple password. The patient does NOT have to create an account.  - If Android, ask patient to go to [employer] and type in WebEx in the search bar. Download [employer], the blue/green circle. The app is free but as with any other app downloads, their phone may require them to verify saved payment information or Android password. The patient does NOT have to create an account.   CONSENT FOR TELE-HEALTH VISIT - PLEASE REVIEW  I hereby voluntarily request, consent and authorize [facility] and its employed or contracted physicians, physician assistants, nurse practitioners or other licensed health care professionals (the Practitioner), to provide me with telemedicine health care services (the "Services") as deemed necessary by the treating Practitioner. I  acknowledge and consent to receive the Services by the Practitioner via telemedicine. I understand that the telemedicine visit will involve communicating with the Practitioner through live audiovisual communication technology and the disclosure of certain medical information by electronic transmission. I acknowledge that I have been given the opportunity to request an in-person assessment or other available alternative prior to the telemedicine  visit and am voluntarily participating in the telemedicine visit.  I understand that I have the right to withhold or withdraw my consent to the use of telemedicine in the course of my care at any time, without affecting my right to future care or treatment, and that the Practitioner or I may terminate the telemedicine visit at any time. I understand that I have the right to inspect all information obtained and/or recorded in the course of the telemedicine visit and may receive copies of available information for a reasonable fee.  I understand that some of the potential risks of receiving the Services via telemedicine include:  Marland Kitchen Delay or interruption in medical evaluation due to technological equipment failure or disruption; . Information transmitted may not be sufficient (e.g. poor resolution of images) to allow for appropriate medical decision making by the Practitioner; and/or  . In rare instances, security protocols could fail, causing a breach of personal health information.  Furthermore, I acknowledge that it is my responsibility to provide information about my medical history, conditions and care that is complete and accurate to the best of my ability. I acknowledge that Practitioner's advice, recommendations, and/or decision may be based on factors not within their control, such as incomplete or inaccurate data provided by me or distortions of diagnostic images or specimens that may result from electronic transmissions. I understand that the practice of medicine is not an exact science and that Practitioner makes no warranties or guarantees regarding treatment outcomes. I acknowledge that I will receive a copy of this consent concurrently upon execution via email to the email address I last provided but may also request a printed copy by calling the office of [facility].    I understand that my insurance will be billed for this visit.   I have read or had this consent read to me. . I  understand the contents of this consent, which adequately explains the benefits and risks of the Services being provided via telemedicine.  . I have been provided ample opportunity to ask questions regarding this consent and the Services and have had my questions answered to my satisfaction. . I give my informed consent for the services to be provided through the use of telemedicine in my medical care  By participating in this telemedicine visit I agree to the above.

## 2019-02-03 ENCOUNTER — Telehealth: Payer: Self-pay | Admitting: Cardiology

## 2019-02-03 NOTE — Telephone Encounter (Signed)
Follow Up:    Pt calling back to pre register.

## 2019-02-04 ENCOUNTER — Telehealth: Payer: Self-pay | Admitting: Cardiology

## 2019-02-04 NOTE — Telephone Encounter (Signed)
Patient returning call for pre-register.  Thanks!   

## 2019-02-04 NOTE — Telephone Encounter (Signed)
LVM to pre reg. 02-04-19 ST °

## 2019-02-04 NOTE — Telephone Encounter (Signed)
F/U Message             Patient has returned "Nicole Haynes's" call and would like a call back on her cell the [phone] #.

## 2019-02-05 ENCOUNTER — Telehealth (INDEPENDENT_AMBULATORY_CARE_PROVIDER_SITE_OTHER): Payer: Medicare Other | Admitting: Cardiology

## 2019-02-05 ENCOUNTER — Telehealth: Payer: Self-pay | Admitting: Cardiology

## 2019-02-05 ENCOUNTER — Encounter: Payer: Self-pay | Admitting: Cardiology

## 2019-02-05 VITALS — BP 127/62 | HR 66 | Ht 64.0 in | Wt 139.0 lb

## 2019-02-05 DIAGNOSIS — R9431 Abnormal electrocardiogram [ECG] [EKG]: Secondary | ICD-10-CM | POA: Insufficient documentation

## 2019-02-05 DIAGNOSIS — I480 Paroxysmal atrial fibrillation: Secondary | ICD-10-CM | POA: Diagnosis not present

## 2019-02-05 NOTE — Patient Instructions (Addendum)
Medication Instructions:   Continue taking medications as you are already doing. Remember for breakthrough episodes of atrial fibrillation, you would take the as needed additional dose of flecainide -- -- BREAKTHROUGH OF AFIB  TAKE 100 MG AT THAT TIME IF RHYTHM BREAKS GO BACK TO 50 MG , IF NOT IN 6 HOUR MAY A TAKE ANOTHER 100 MG  (MAXIUM DOSE OF 200 MG IN A DAY up to 3 days, or until It breaks).   If you need a refill on your cardiac medications before your next appointment, please call your pharmacy.   Lab work: None  Testing/Procedures: None  Follow-Up: I would like to have you come in for nurse visit in 3 weeks to get an EKG just to reevaluate the QT on the EKG. Otherwise, provide the QT is normal or not concerning, we could have you follow-up in 12 months.

## 2019-02-05 NOTE — Telephone Encounter (Signed)
Follow Up:    Returning your cal, she has an appt this afternoon.

## 2019-02-05 NOTE — Telephone Encounter (Signed)
LVM to pre reg. 02-04-19 ST LVM to pre reg. 02-05-19 ST

## 2019-02-05 NOTE — Progress Notes (Signed)
Virtual Visit via Video Note   This visit type was conducted due to national recommendations for restrictions regarding the COVID-19 Pandemic (e.g. social distancing) in an effort to limit this patient's exposure and mitigate transmission in our community.  Due to her co-morbid illnesses, this patient is at least at moderate risk for complications without adequate follow up.  This format is felt to be most appropriate for this patient at this time.  All issues noted in this document were discussed and addressed.  A limited physical exam was performed with this format.  Please refer to the patient's chart for her consent to telehealth for [facility].   Patient has given verbal permission to conduct this visit via virtual appointment and to bill insurance 01/27/2019 5:19 PM     Evaluation Performed:  Follow-up visit  Date:  02/05/2019   ID:  Nicole Haynes, DOB 1950/12/14, MRN [id number]  Patient Location: Home Provider Location: Home  PCP:  Maurice Small, MD  Cardiologist:  [name], MD  Electrophysiologist:  None   Chief Complaint:  6 month f/u - PAF  History of Present Illness:    Nicole Haynes is a 68 y.o. female with PMH notable for PAF (noted on event monitor -- managed with Flecainide & Diltiazem along with Eliquis for anticoagulation) who presents via audio/video conferencing for a telehealth visit today as a ~6 month f/u.    Nicole Haynes was last seen on August 04, 2018.  She was feeling well with no further episodes of A. fib.  No signs of arrhythmia.  She does note a little bit of lag time getting started with activity, but otherwise doing fine.  -Nicole Haynes was seen at [facility] emergency room on April 13 with severe abdominal pain.  Was diagnosed with a UTI.  (Was also noted to have a long QT on EKG)   Interval History:  Patient was doing quite well today.  Feeling better from UTI.  She really says this year has not had any breakthrough  episodes of palpitations or A. fib since I last saw her.  Has not had any symptoms whatsoever except for one time when she was driving her car and lots of traffic, turned her head rapidly to 1 side and felt quite dizzy.  Otherwise no symptoms. She has been quite active during the COVID-19 pandemic quarantine --> currently working on a project to build a shed in her backyard. She is having no symptoms of A. fib, dyspnea, chest pain or any bleeding issues from being on Eliquis.  Cardiovascular ROS: no chest pain or dyspnea on exertion negative for - edema, irregular heartbeat, loss of consciousness, orthopnea, palpitations, paroxysmal nocturnal dyspnea, rapid heart rate, shortness of breath or TIA/amaurosis fugax, melena, hematochezia, hematuria or epistaxis.  Claudication.  The patient does not have symptoms concerning for COVID-19 infection (fever, chills, cough, or new shortness of breath).  The patient is practicing social distancing.  Grocery store every 2 weeks - wears mask.   Had been in [place] early March.  Self quarantined x 2 weeks - no Sx.    ROS:  Please see the history of present illness.    Review of Systems  Constitutional: Negative for malaise/fatigue.  HENT: Negative for congestion (allergies) and nosebleeds.   Respiratory: Negative for sputum production.        Only allergy related  Gastrointestinal: Positive for abdominal pain (better since starting Abx for UTI). Negative for blood in stool and melena.  Genitourinary: Positive  for dysuria (improved since Abx for UTI). Negative for hematuria.  Musculoskeletal: Negative for myalgias.  Neurological: Positive for dizziness (1 dizzy spell - per HPI).  Endo/Heme/Allergies: Positive for environmental allergies.  All other systems reviewed and are negative.   Past Medical History:  Diagnosis Date  . Dyslipidemia    On rosuvastatin, Kr[place] oral  . Hypothyroidism due to Graves' disease status post radiation iodide    On levothyroxine   . Paroxysmal atrial fibrillation (HCC)    Rate controlled with diltiazem, rhythm control with flecainide. CHA2DS2Vasc = 2; on Eliquis   Past Surgical History:  Procedure Laterality Date  . CARDIAC EVENT MONITOR  11/2017   30 monitor: 98% normal sinus rhythm.  2% PAF with rates as high as 160 bpm.  . NM MYOVIEW LTD  12/2017   LOW RISK.  Walk for 9 minutes.  Peak heart rate 179 (116% of MPHR).  No EKG changes to suggest ischemia.  No ischemia or infarction noted on Myoview.  EF 65-70%.  . None    . TRANSTHORACIC ECHOCARDIOGRAM  11/28/2017   Normal LV size and function.  EF 60-65%.  Wall motion.  Mild AI, Mild-mod TR with mildly elevated PA pressures.     Current Meds  Medication Sig  . Calcium Carb-Cholecalciferol (CALCIUM 500+D3) 500-400 MG-UNIT TABS Take 400 mg by mouth 2 (two) times daily.  Marland Kitchen. diltiazem (CARDIZEM CD) 120 MG 24 hr capsule Take 1 capsule (120 mg total) by mouth daily.  Marland Kitchen. ELIQUIS 5 MG TABS tablet TAKE 1 TABLET BY MOUTH TWICE A DAY  . flecainide (TAMBOCOR) 50 MG tablet TAKE 1 TABLET BY MOUTH TWICE A DAY  . levothyroxine (SYNTHROID) 88 MCG tablet TAKE 1 CAPSULE BY MOUTH EVERY DAY IN THE MORNING ON EMPTY STOMACH  . Magnesium 250 MG TABS Take 250 mg by mouth daily.  . nitrofurantoin, macrocrystal-monohydrate, (MACROBID) 100 MG capsule   . rosuvastatin (CRESTOR) 5 MG tablet Take 5 mg by mouth daily.  Marland Kitchen. VITAMIN D, CHOLECALCIFEROL, PO Take by mouth daily.  . [DISCONTINUED] levothyroxine (SYNTHROID, LEVOTHROID) 100 MCG tablet Take 100 mcg by mouth daily before breakfast.   . [DISCONTINUED] nitrofurantoin, macrocrystal-monohydrate, (MACROBID) 100 MG capsule Take by mouth.     Allergies:   Penicillins   Social History   Tobacco Use  . Smoking status: Former Games developermoker  . Smokeless tobacco: Never Used  Substance Use Topics  . Alcohol use: Yes    Comment: wine  . Drug use: No     Family Hx: The patient's family history is negative for Breast cancer.  No known cardiovascular  disease.   Prior CV studies:   The following studies were reviewed today: . None  Labs/Other Tests and Data Reviewed:    EKG: EKG from [facility] suggested QT interval of 500 (unable to see EKG)  Recent Labs: No results found for requested labs within last 8760 hours.   Recent Lipid Panel No results found for: CHOL, TRIG, HDL, CHOLHDL, LDLCALC, LDLDIRECT  Wt Readings from Last 3 Encounters:  02/05/19 139 lb (63 kg)  08/04/18 149 lb 6.4 oz (67.8 kg)  02/04/18 145 lb (65.8 kg)     Objective:    Vital Signs:  BP 127/62   Pulse 66   Ht 5\' 4"  (1.626 m)   Wt 139 lb (63 kg)   BMI 23.86 kg/m  -- had been outside exercising building a shed. VITAL SIGNS:  reviewed GEN:  no acute distress RESPIRATORY:  normal respiratory effort, symmetric expansion SKIN:  no rash, lesions or ulcers. NEURO:  alert and oriented x 3, no obvious focal deficit PSYCH:  normal affect   ASSESSMENT & PLAN:    Problem List Items Addressed This Visit    Long QT interval    Long QT on EKG from [facility].  I would like for her to get an EKG done.  With the hopes that the COVID-19 surgery should be complete within the next few weeks, will have her come in 3 weeks to have a nurse visit for EKG.  Provided this is normal, I think we can just have her follow-up next year.  If it is abnormal then we will need to consider what we do with flecainide and diltiazem.      PAF (paroxysmal atrial fibrillation) (HCC):  CHA2DS2-VASc Score 2 (Age 73, female) - Primary (Chronic)    Doing well.  No breakthrough episodes of A. fib.  Tolerating low-dose diltiazem and flecainide.  Maintaining sinus rhythm. No longer having the exertional dyspnea or fatigue now that we have adjusted her doses.  Overall quite stable.  No bleeding issues on Eliquis. --Okay to stop Eliquis 24-72 hours preprocedure depending on the procedure.  72 hours for high risk procedures, 48 hours for intermediate risk in 24 hours for low risk.          COVID-19 Education: The signs and symptoms of COVID-19 were discussed with the patient and how to seek care for testing (follow up with PCP or arrange E-visit).   The importance of social distancing was discussed today.  Time:   Today, I have spent 15 minutes with the patient with telehealth technology discussing the above problems.     Medication Adjustments/Labs and Tests Ordered: Current medicines are reviewed at length with the patient today.  Concerns regarding medicines are outlined above.   Medication Instructions:  Continue taking medications as you are already doing. Remember for breakthrough episodes of atrial fibrillation, you would take the as needed additional dose of flecainide -- -- BREAKTHROUGH OF AFIB  TAKE 100 MG AT THAT TIME IF RHYTHM BREAKS GO BACK TO 50 MG , IF NOT IN 6 HOUR MAY A TAKE ANOTHER 100 MG  (MAXIUM DOSE OF 200 MG IN A DAY up to 3 days, or until It breaks).  Tests Ordered: No orders of the defined types were placed in this encounter.  - RN visit for EKG in ~3 weeks to assess QT interval.  Medication Changes: none No orders of the defined types were placed in this encounter.   Disposition:  Follow up in 1 year(s)    Signed, [name], MD  02/05/2019 4:58 PM    [facility]

## 2019-02-05 NOTE — Assessment & Plan Note (Signed)
Doing well.  No breakthrough episodes of A. fib.  Tolerating low-dose diltiazem and flecainide.  Maintaining sinus rhythm. No longer having the exertional dyspnea or fatigue now that we have adjusted her doses.  Overall quite stable.  No bleeding issues on Eliquis. --Okay to stop Eliquis 24-72 hours preprocedure depending on the procedure.  72 hours for high risk procedures, 48 hours for intermediate risk in 24 hours for low risk.

## 2019-02-05 NOTE — Assessment & Plan Note (Signed)
Long QT on EKG from [facility].  I would like for her to get an EKG done.  With the hopes that the COVID-19 surgery should be complete within the next few weeks, will have her come in 3 weeks to have a nurse visit for EKG.  Provided this is normal, I think we can just have her follow-up next year.  If it is abnormal then we will need to consider what we do with flecainide and diltiazem.

## 2019-02-26 ENCOUNTER — Ambulatory Visit (INDEPENDENT_AMBULATORY_CARE_PROVIDER_SITE_OTHER): Payer: Medicare Other

## 2019-02-26 ENCOUNTER — Other Ambulatory Visit: Payer: Self-pay

## 2019-02-26 VITALS — BP 115/68 | HR 52 | Ht 64.0 in | Wt 141.6 lb

## 2019-02-26 DIAGNOSIS — R9431 Abnormal electrocardiogram [ECG] [EKG]: Secondary | ICD-10-CM

## 2019-02-26 NOTE — Patient Instructions (Signed)
Medication Instructions:  Your physician recommends that you continue on your current medications as directed. Please refer to the Current Medication list given to you today.  If you need a refill on your cardiac medications before your next appointment, please call your pharmacy.   Lab work: NONE If you have labs (blood work) drawn today and your tests are completely normal, you will receive your results only by: Marland Kitchen MyChart Message (if you have MyChart) OR . A paper copy in the mail If you have any lab test that is abnormal or we need to change your treatment, we will call you to review the results.  Testing/Procedures: NONE  Follow-Up: At [facility], you and your health needs are our priority.  As part of our continuing mission to provide you with exceptional heart care, we have created designated Provider Care Teams.  These Care Teams include your primary Cardiologist (physician) and Advanced Practice Providers (APPs -  Physician Assistants and Nurse Practitioners) who all work together to provide you with the care you need, when you need it. You will need a follow up appointment in 12 months.  Please call our office 2 months in advance to schedule this appointment.  You may see [name], MD or one of the following Advanced Practice Providers on your designated Care Team:   Theodore Demark, PA-C . Joni Reining, DNP, ANP  Any Other Special Instructions Will Be Listed Below (If Applicable). YOUR EKG WAS REVIEWED BY OUR DOCTOR OF THE DAY (DOD), DR. BRIDGETTE CHRISTOPHER, WHO DETERMINED THAT YOUR EKG WAS NORMAL. DR HARDING'S RECOMMENDATION IS THAT YOU FOLLOW UP IN 1 YEAR IF YOUR RESULTS ARE NORMAL.

## 2019-03-18 ENCOUNTER — Other Ambulatory Visit: Payer: Self-pay | Admitting: Cardiology

## 2019-05-18 NOTE — Telephone Encounter (Signed)
Spoke with pharmacist , not able to donate blood while taking eliquis

## 2019-06-16 ENCOUNTER — Other Ambulatory Visit (HOSPITAL_COMMUNITY): Payer: Self-pay | Admitting: Cardiology

## 2019-10-06 ENCOUNTER — Other Ambulatory Visit: Payer: Self-pay | Admitting: Family Medicine

## 2019-10-06 DIAGNOSIS — Z1231 Encounter for screening mammogram for malignant neoplasm of breast: Secondary | ICD-10-CM

## 2019-10-08 ENCOUNTER — Ambulatory Visit: Payer: Medicare Other | Attending: Internal Medicine

## 2019-10-08 ENCOUNTER — Other Ambulatory Visit: Payer: Medicare Other

## 2019-10-08 DIAGNOSIS — Z20822 Contact with and (suspected) exposure to covid-19: Secondary | ICD-10-CM

## 2019-10-09 LAB — NOVEL CORONAVIRUS, NAA: SARS-CoV-2, NAA: NOT DETECTED

## 2019-11-18 ENCOUNTER — Ambulatory Visit: Payer: Medicare Other

## 2019-11-24 ENCOUNTER — Other Ambulatory Visit: Payer: Self-pay

## 2019-11-24 ENCOUNTER — Ambulatory Visit
Admission: RE | Admit: 2019-11-24 | Discharge: 2019-11-24 | Disposition: A | Discharge status: Home / Self Care | Payer: Medicare PPO | Source: Ambulatory Visit | Attending: Family Medicine | Admitting: Family Medicine

## 2019-11-24 DIAGNOSIS — Z1231 Encounter for screening mammogram for malignant neoplasm of breast: Secondary | ICD-10-CM

## 2019-11-25 ENCOUNTER — Ambulatory Visit
Admission: RE | Admit: 2019-11-25 | Discharge: 2019-11-25 | Disposition: A | Discharge status: Home / Self Care | Payer: Medicare PPO | Source: Ambulatory Visit | Attending: Family Medicine | Admitting: Family Medicine

## 2019-11-25 ENCOUNTER — Other Ambulatory Visit: Payer: Self-pay | Admitting: Family Medicine

## 2019-11-25 ENCOUNTER — Ambulatory Visit: Payer: Medicare PPO

## 2019-11-25 ENCOUNTER — Other Ambulatory Visit: Payer: Self-pay

## 2019-11-25 DIAGNOSIS — R928 Other abnormal and inconclusive findings on diagnostic imaging of breast: Secondary | ICD-10-CM

## 2019-11-26 ENCOUNTER — Ambulatory Visit: Payer: Medicare PPO

## 2019-12-07 ENCOUNTER — Other Ambulatory Visit: Payer: Self-pay | Admitting: Cardiology

## 2019-12-14 ENCOUNTER — Other Ambulatory Visit (HOSPITAL_COMMUNITY): Payer: Self-pay | Admitting: Cardiology

## 2019-12-17 ENCOUNTER — Other Ambulatory Visit: Payer: Self-pay | Admitting: Cardiology

## 2019-12-17 NOTE — Telephone Encounter (Signed)
Scr= 1.04 on care everywhere (01/2019)

## 2020-01-08 ENCOUNTER — Other Ambulatory Visit (HOSPITAL_COMMUNITY): Payer: Self-pay | Admitting: Cardiology

## 2020-02-04 ENCOUNTER — Other Ambulatory Visit (HOSPITAL_COMMUNITY): Payer: Self-pay | Admitting: Cardiology

## 2020-02-08 ENCOUNTER — Ambulatory Visit: Payer: Medicare PPO | Admitting: Cardiology

## 2020-02-09 ENCOUNTER — Encounter: Payer: Self-pay | Admitting: Cardiology

## 2020-02-09 ENCOUNTER — Ambulatory Visit: Payer: Medicare PPO | Admitting: Cardiology

## 2020-02-09 ENCOUNTER — Other Ambulatory Visit: Payer: Self-pay

## 2020-02-09 VITALS — BP 118/69 | HR 60 | Temp 97.9°F | Resp 14 | Ht 64.0 in | Wt 154.0 lb

## 2020-02-09 DIAGNOSIS — I480 Paroxysmal atrial fibrillation: Secondary | ICD-10-CM | POA: Diagnosis not present

## 2020-02-09 NOTE — Progress Notes (Signed)
Primary Care Provider: Maurice Small, MD Cardiologist: [name], MD Electrophysiologist: None  Clinic Note: Chief Complaint  Patient presents with  . Follow-up    No new issues  . Atrial Fibrillation   HPI:    Nicole Haynes is a 69 y.o. female with a PMH below who presents today for annual follow-up for A. Fib -> managed with flecainide and diltiazem along with Eliquis.  Nicole Haynes was last seen last year on February 05, 2019 via telemedicine.  She was doing much better having recovered from UTI.  She had not any breakthrough episodes of A. fib since her previous visit.  Actually not really noticing any symptoms at all.  Was still maintaining activity during her COVID-19 quarantine doing odd jobs/projects around the house and yard.  Recent Hospitalizations: None  Reviewed  CV studies:    The following studies were reviewed today: (if available, images/films reviewed: From Epic Chart or Care Everywhere) . None:   Interval History:   Nicole Haynes returns today here for in person visit doing quite well although she sheepishly acknowledges that she has gained 10 to 15pounds during the whole pandemic isolation timeframe.  She ran out of things to do around the house, and is getting aerobic cabin fever.  She is still staying active and walking.  Now that weather is improved and the restrictions of Covid have loosened up a little bit, she is out about more.  She actually has had both of her Covid vaccines which makes her feel little more comfortable interacting with people.  Much like last visit, she has not had any breakthrough episodes of A. fib.  She has not felt any rapid regular heartbeats palpitations.  No chest pain or pressure.  CardiovascularReview of Symptoms (Summary): no chest pain or dyspnea on exertion negative for - edema, irregular heartbeat, orthopnea, palpitations, paroxysmal nocturnal dyspnea, rapid heart rate, shortness of breath or  TIA/amaurosis fugax, claudication. No syncope or near syncope.  The patient does not have symptoms concerning for COVID-19 infection (fever, chills, cough, or new shortness of breath).  The patient is practicing social distancing & Masking.   She has had her to COVID-19 vaccine injections.  REVIEWED OF SYSTEMS   Review of Systems  Constitutional: Negative for malaise/fatigue and weight loss.  HENT: Negative for nosebleeds.   Respiratory: Negative.   Gastrointestinal: Negative for abdominal pain, blood in stool and melena.  Genitourinary: Negative for hematuria.  Musculoskeletal: Positive for joint pain (Mild arthritis pains but otherwise doing well).  Neurological: Negative for dizziness and focal weakness.  Endo/Heme/Allergies: Positive for environmental allergies. Does not bruise/bleed easily.  Psychiatric/Behavioral: Negative.    I have reviewed and (if needed) personally updated the patient's problem list, medications, allergies, past medical and surgical history, social and family history.   PAST MEDICAL HISTORY   Past Medical History:  Diagnosis Date  . Dyslipidemia    On rosuvastatin, Krill oral  . Hypothyroidism due to Graves' disease status post radiation iodide    On levothyroxine  . Paroxysmal atrial fibrillation (HCC)    Rate controlled with diltiazem, rhythm control with flecainide. CHA2DS2Vasc = 2; on Eliquis    PAST SURGICAL HISTORY   Past Surgical History:  Procedure Laterality Date  . CARDIAC EVENT MONITOR  11/2017   30 monitor: 98% normal sinus rhythm.  2% PAF with rates as high as 160 bpm.  . NM MYOVIEW LTD  12/2017   LOW RISK.  Walk for 9 minutes.  Peak heart  rate 179 (116% of MPHR).  No EKG changes to suggest ischemia.  No ischemia or infarction noted on Myoview.  EF 65-70%.  . None    . TRANSTHORACIC ECHOCARDIOGRAM  11/28/2017   Normal LV size and function.  EF 60-65%.  Wall motion.  Mild AI, Mild-mod TR with mildly elevated PA pressures.     MEDICATIONS/ALLERGIES   Current Meds  Medication Sig  . Ascorbic Acid (VITAMIN C) 100 MG tablet Take 100 mg by mouth daily.  . Calcium Carb-Cholecalciferol (CALCIUM 500+D3) 500-400 MG-UNIT TABS Take 400 mg by mouth 2 (two) times daily.  Marland Kitchen diltiazem (CARDIZEM CD) 120 MG 24 hr capsule TAKE 1 CAPSULE BY MOUTH EVERY DAY  . ELIQUIS 5 MG TABS tablet TAKE 1 TABLET BY MOUTH TWICE A DAY  . flecainide (TAMBOCOR) 50 MG tablet TAKE 1 TABLET (50 MG TOTAL) BY MOUTH 2 (TWO) TIMES DAILY. **NEEDS OV**  . levothyroxine (SYNTHROID) 88 MCG tablet TAKE 1 CAPSULE BY MOUTH EVERY DAY IN THE MORNING ON EMPTY STOMACH  . Magnesium 250 MG TABS Take 250 mg by mouth daily.  . riboflavin (VITAMIN B-2) 100 MG TABS tablet Take by mouth.  . rosuvastatin (CRESTOR) 5 MG tablet Take 5 mg by mouth daily.  . simvastatin (ZOCOR) 10 MG tablet Take by mouth.  . [DISCONTINUED] Calcium 500-125 MG-UNIT TABS Take by mouth.  . [DISCONTINUED] Cholecalciferol (VITAMIN D3) 100000 UNIT/GM POWD Take by mouth.  . [DISCONTINUED] diltiazem (TIAZAC) 120 MG 24 hr capsule TAKE 1 CAPSULE BY MOUTH EVERY DAY  . [DISCONTINUED] VITAMIN D, CHOLECALCIFEROL, PO Take by mouth daily.    Allergies  Allergen Reactions  . Penicillins     SOCIAL HISTORY/FAMILY HISTORY   Reviewed in Epic:  Pertinent findings: No new changes.  OBJCTIVE -PE, EKG, labs   Wt Readings from Last 3 Encounters:  02/09/20 154 lb (69.9 kg)  02/26/19 141 lb 9.6 oz (64.2 kg)  02/05/19 139 lb (63 kg)    Physical Exam: BP 118/69   Pulse 60   Temp 97.9 F (36.6 C)   Resp 14   Ht 5\' 4"  (1.626 m)   Wt 154 lb (69.9 kg)   SpO2 98%   BMI 26.43 kg/m  Physical Exam  Constitutional: She is oriented to person, place, and time. She appears well-developed and well-nourished. No distress.  Healthy-appearing.  Well-groomed.  Appears younger than stated age.  HENT:  Head: Normocephalic and atraumatic.  Neck: No hepatojugular reflux and no JVD present. Carotid bruit is not  present.  Cardiovascular: Normal rate, regular rhythm, normal heart sounds and intact distal pulses.  No extrasystoles are present. PMI is not displaced. Exam reveals no gallop and no friction rub.  No murmur heard. Pulmonary/Chest: Effort normal and breath sounds normal. No respiratory distress. She has no wheezes. She has no rales.  Musculoskeletal:        General: No edema. Normal range of motion.     Cervical back: Normal range of motion and neck supple.  Neurological: She is alert and oriented to person, place, and time.  Psychiatric: She has a normal mood and affect. Her behavior is normal. Judgment and thought content normal.  Vitals reviewed.   Adult ECG Report  Rate: 60 ;  Rhythm: normal sinus rhythm and Normal axis, intervals and durations.;   Narrative Interpretation: Normal EKG  Recent Labs: 02/08/2020: TC 155, TG 76, HDL 58, LDL 91. Cr 1.09, K+ 4.3.  TSH 0.5 No results found for: CHOL, HDL, LDLCALC, LDLDIRECT, TRIG, CHOLHDL Lab  Results  Component Value Date   CREATININE 0.99 09/15/2016   BUN 18 09/15/2016   NA 141 09/15/2016   K 3.4 (L) 09/15/2016   CL 105 09/15/2016   CO2 25 09/15/2016    ASSESSMENT/PLAN    Problem List Items Addressed This Visit    PAF (paroxysmal atrial fibrillation) ([place]):  CHA2DS2-VASc Score 2 (Age 34, female) - Primary (Chronic)    Doing amazingly well.  Maintaining sinus rhythm with flecainide for rhythm control.  Tolerating current dose. Also on backup diltiazem 120 mg with no fatigue. Anticoagulated with Eliquis with no bleeding issues.  Prescription refills have been updated.   Okay to hold Eliquis 2472 hours preop for any procedures or surgeries.  72 hours for high risk, 48 for intermediate risk and 24 for low risk.      Relevant Medications   simvastatin (ZOCOR) 10 MG tablet   Other Relevant Orders   EKG 12-Lead (Completed)       COVID-19 Education: The signs and symptoms of COVID-19 were discussed with the patient and how to  seek care for testing (follow up with PCP or arrange E-visit).   The importance of social distancing and COVID-19 vaccination was discussed today.  I spent a total of 15 minutes with the patient. >  50% of the time was spent in direct patient consultation.  Additional time spent with chart review  / charting (studies, outside notes, etc): 5 Total Time: 20 min   Current medicines are reviewed at length with the patient today.  (+/- concerns) none  Notice: This dictation was prepared with Dragon dictation along with smaller phrase technology. Any transcriptional errors that result from this process are unintentional and may not be corrected upon review.  Patient Instructions / Medication Changes & Studies & Tests Ordered   Patient Instructions  Medication Instructions:  The current medical regimen is effective;  continue present plan and medications as directed. Please refer to the Current Medication list given to you today.  *If you need a refill on your cardiac medications before your next appointment, please call your pharmacy*  Follow-Up: Your next appointment:  12 month(s) Please call our office 2 months in advance to schedule this appointment In Person with You may see Glenetta Hew, MD or one of the following Advanced Practice Providers on your designated Care Team:  Rosaria Ferries, PA-C Jory Sims, DNP, ANP Cadence Kathlen Mody, NP.  At [facility], you and your health needs are our priority.  As part of our continuing mission to provide you with exceptional heart care, we have created designated Provider Care Teams.  These Care Teams include your primary Cardiologist (physician) and Advanced Practice Providers (APPs -  Physician Assistants and Nurse Practitioners) who all work together to provide you with the care you need, when you need it.      Studies Ordered:   Orders Placed This Encounter  Procedures  . EKG 12-Lead     Glenetta Hew, M.D., M.S. Interventional  Cardiologist   Pager # [phone] Phone # [phone] [address] [place], [place] [address]   Thank you for choosing Heartcare at [facility]!!

## 2020-02-09 NOTE — Patient Instructions (Signed)
Medication Instructions:  The current medical regimen is effective;  continue present plan and medications as directed. Please refer to the Current Medication list given to you today.  *If you need a refill on your cardiac medications before your next appointment, please call your pharmacy*  Follow-Up: Your next appointment:  12 month(s) Please call our office 2 months in advance to schedule this appointment In Person with You may see [name], MD or one of the following Advanced Practice Providers on your designated Care Team:  Theodore Demark, PA-C Joni Reining, DNP, ANP Cadence Fransico Michael, NP.  At [facility], you and your health needs are our priority.  As part of our continuing mission to provide you with exceptional heart care, we have created designated Provider Care Teams.  These Care Teams include your primary Cardiologist (physician) and Advanced Practice Providers (APPs -  Physician Assistants and Nurse Practitioners) who all work together to provide you with the care you need, when you need it.

## 2020-02-13 ENCOUNTER — Encounter: Payer: Self-pay | Admitting: Cardiology

## 2020-02-13 NOTE — Assessment & Plan Note (Addendum)
Doing amazingly well.  Maintaining sinus rhythm with flecainide for rhythm control.  Tolerating current dose. Also on backup diltiazem 120 mg with no fatigue. Anticoagulated with Eliquis with no bleeding issues.  Prescription refills have been updated.   Okay to hold Eliquis 2472 hours preop for any procedures or surgeries.  72 hours for high risk, 48 for intermediate risk and 24 for low risk.

## 2020-03-10 ENCOUNTER — Other Ambulatory Visit (HOSPITAL_COMMUNITY): Payer: Self-pay | Admitting: Cardiology

## 2020-03-16 ENCOUNTER — Other Ambulatory Visit: Payer: Self-pay

## 2020-03-17 MED ORDER — DILTIAZEM HCL ER COATED BEADS 120 MG PO CP24: 120.0000 mg | ORAL_CAPSULE | Freq: Every day | ORAL | 3 refills | Status: DC

## 2020-03-17 NOTE — Telephone Encounter (Signed)
Medication refilled

## 2020-03-23 DIAGNOSIS — L821 Other seborrheic keratosis: Secondary | ICD-10-CM | POA: Diagnosis not present

## 2020-03-23 DIAGNOSIS — L814 Other melanin hyperpigmentation: Secondary | ICD-10-CM | POA: Diagnosis not present

## 2020-03-23 DIAGNOSIS — D225 Melanocytic nevi of trunk: Secondary | ICD-10-CM | POA: Diagnosis not present

## 2020-03-23 DIAGNOSIS — L578 Other skin changes due to chronic exposure to nonionizing radiation: Secondary | ICD-10-CM | POA: Diagnosis not present

## 2020-03-23 DIAGNOSIS — L570 Actinic keratosis: Secondary | ICD-10-CM | POA: Diagnosis not present

## 2020-04-17 DIAGNOSIS — Z1211 Encounter for screening for malignant neoplasm of colon: Secondary | ICD-10-CM | POA: Diagnosis not present

## 2020-04-17 DIAGNOSIS — Z7901 Long term (current) use of anticoagulants: Secondary | ICD-10-CM | POA: Diagnosis not present

## 2020-04-18 ENCOUNTER — Telehealth: Payer: Self-pay

## 2020-04-18 NOTE — Telephone Encounter (Signed)
   Primary Cardiologist: [name], MD  Chart reviewed as part of pre-operative protocol coverage. Patient was contacted 04/18/2020 in reference to pre-operative risk assessment for pending surgery as outlined below.  Nicole Haynes was last seen on 01/2020 by Dr. Herbie Baltimore. History of PAF, dyslipidemia, hypothyroidism. Normal NST 12/2017 with normal LVEF. She affirms she is doing well from cardiac standpoint without any breakthrough symptoms and is felt to be low risk for procedure. Therefore, based on ACC/AHA guidelines, the patient would be at acceptable risk for the planned procedure without further cardiovascular testing.   Will route to pharm for input on Eliquis then plan to bundle rec onward. As an aside I noted she has 2 statins on med list. She clarified she is on Crestor not Zocor. Updated list to reflect this.  Nicole Montana, PA-C 04/18/2020, 3:59 PM

## 2020-04-18 NOTE — Telephone Encounter (Signed)
   Primary Cardiologist: [name], MD  Chart revisited as part of pre-operative protocol coverage. Given past medical history and time since last visit, based on ACC/AHA guidelines, Nicole Haynes would be at acceptable risk for the planned procedure without further cardiovascular testing. Per office protocol, patient can hold Eliquis for 2 days prior to procedure. Patient will not need bridging with Lovenox (enoxaparin) around procedure.  I will route this recommendation to the requesting party via Epic fax function and remove from pre-op pool.   Please call with questions.  Laurann Montana, PA-C 04/18/2020, 4:48 PM

## 2020-04-18 NOTE — Telephone Encounter (Signed)
Patient with diagnosis of atrial fibrillation on Eliquis for anticoagulation.    Procedure: colonoscopy Date of procedure: 05/31/2020  CHADS2-VASc score of  2 (AGE, female)  CrCl 54.5 Platelet count 243  Per office protocol, patient can hold Eliquis for 2 days prior to procedure.    Patient will not need bridging with Lovenox (enoxaparin) around procedure.

## 2020-04-18 NOTE — Telephone Encounter (Signed)
   [facility] Pre-operative Risk Assessment    HEARTCARE STAFF: - Please ensure there is not already an duplicate clearance open for this procedure. - Under Visit Info/Reason for Call, type in Other and utilize the format Clearance MM/DD/YY or Clearance TBD. Do not use dashes or single digits. - If request is for dental extraction, please clarify the # of teeth to be extracted.  Request for surgical clearance:  1. What type of surgery is being performed? Colonoscopy    2. When is this surgery scheduled? 05/31/2020   3. What type of clearance is required (medical clearance vs. Pharmacy clearance to hold med vs. Both)? Pharmacy   4. Are there any medications that need to be held prior to surgery and how long? Eliquis, cease instructions from Cardiologist    5. Practice name and name of physician performing surgery? [facility], Dr. Penelope Coop   6. What is the office phone number? [phone]   7.   What is the office fax number? [phone]  8.   Anesthesia type (None, local, MAC, general) ? Not Listed   Nicole Haynes 04/18/2020, 3:23 PM  _________________________________________________________________   (provider comments below)

## 2020-05-31 DIAGNOSIS — Z1211 Encounter for screening for malignant neoplasm of colon: Secondary | ICD-10-CM | POA: Diagnosis not present

## 2020-06-08 ENCOUNTER — Other Ambulatory Visit (HOSPITAL_COMMUNITY): Payer: Self-pay | Admitting: Cardiology

## 2020-06-12 ENCOUNTER — Other Ambulatory Visit: Payer: Self-pay | Admitting: Cardiology

## 2020-07-16 DIAGNOSIS — R300 Dysuria: Secondary | ICD-10-CM | POA: Diagnosis not present

## 2020-07-16 DIAGNOSIS — N3000 Acute cystitis without hematuria: Secondary | ICD-10-CM | POA: Diagnosis not present

## 2020-08-07 DIAGNOSIS — E7800 Pure hypercholesterolemia, unspecified: Secondary | ICD-10-CM | POA: Diagnosis not present

## 2020-08-07 DIAGNOSIS — I480 Paroxysmal atrial fibrillation: Secondary | ICD-10-CM | POA: Diagnosis not present

## 2020-08-07 DIAGNOSIS — N3090 Cystitis, unspecified without hematuria: Secondary | ICD-10-CM | POA: Diagnosis not present

## 2020-08-07 DIAGNOSIS — E039 Hypothyroidism, unspecified: Secondary | ICD-10-CM | POA: Diagnosis not present

## 2020-08-07 DIAGNOSIS — D6869 Other thrombophilia: Secondary | ICD-10-CM | POA: Diagnosis not present

## 2020-08-29 DIAGNOSIS — N3090 Cystitis, unspecified without hematuria: Secondary | ICD-10-CM | POA: Diagnosis not present

## 2020-10-17 ENCOUNTER — Other Ambulatory Visit: Payer: Self-pay | Admitting: Family Medicine

## 2020-10-17 DIAGNOSIS — Z1231 Encounter for screening mammogram for malignant neoplasm of breast: Secondary | ICD-10-CM

## 2020-10-24 DIAGNOSIS — Z20822 Contact with and (suspected) exposure to covid-19: Secondary | ICD-10-CM | POA: Diagnosis not present

## 2020-10-24 DIAGNOSIS — R5383 Other fatigue: Secondary | ICD-10-CM | POA: Diagnosis not present

## 2020-10-24 DIAGNOSIS — Z03818 Encounter for observation for suspected exposure to other biological agents ruled out: Secondary | ICD-10-CM | POA: Diagnosis not present

## 2020-10-24 DIAGNOSIS — J029 Acute pharyngitis, unspecified: Secondary | ICD-10-CM | POA: Diagnosis not present

## 2020-11-30 ENCOUNTER — Other Ambulatory Visit: Payer: Self-pay

## 2020-11-30 ENCOUNTER — Ambulatory Visit
Admission: RE | Admit: 2020-11-30 | Discharge: 2020-11-30 | Disposition: A | Discharge status: Home / Self Care | Payer: Medicare PPO | Source: Ambulatory Visit | Attending: Family Medicine | Admitting: Family Medicine

## 2020-11-30 DIAGNOSIS — Z1231 Encounter for screening mammogram for malignant neoplasm of breast: Secondary | ICD-10-CM | POA: Diagnosis not present

## 2020-12-16 ENCOUNTER — Other Ambulatory Visit: Payer: Self-pay | Admitting: Cardiology

## 2021-02-06 ENCOUNTER — Ambulatory Visit: Payer: Medicare PPO | Admitting: Family

## 2021-02-06 ENCOUNTER — Other Ambulatory Visit: Payer: Self-pay

## 2021-02-06 ENCOUNTER — Ambulatory Visit (INDEPENDENT_AMBULATORY_CARE_PROVIDER_SITE_OTHER): Payer: Medicare PPO

## 2021-02-06 ENCOUNTER — Encounter: Payer: Self-pay | Admitting: Family

## 2021-02-06 DIAGNOSIS — M722 Plantar fascial fibromatosis: Secondary | ICD-10-CM | POA: Diagnosis not present

## 2021-02-06 DIAGNOSIS — M79674 Pain in right toe(s): Secondary | ICD-10-CM

## 2021-02-06 DIAGNOSIS — M79672 Pain in left foot: Secondary | ICD-10-CM

## 2021-02-06 NOTE — Progress Notes (Signed)
Office Visit Note   Patient: Nicole Haynes           Date of Birth: [birth year]           MRN: [id number] Visit Date: 02/06/2021              Requested by: Maurice Small, MD [address] [place],  [place] [address] PCP: Maurice Small, MD  No chief complaint on file.     HPI: The patient is a 70 year old woman who presents today with 2 separate issues.  She is complaining of left heel pain this has been waxing and waning over the last 6 weeks.  She has been treating it as if she has plantar fasciitis has tried rolling her heel on bottles of ice she has been taking ibuprofen and Tylenol.  She has gotten new shoes she has tried Ons.  Reports she typically wears sketchers.  Heel pain worst when she first wakes in the morning or gets up from prolonged rest.  This gradually eases with prolonged walking.  She has also been having pain over her fourth toe for several months now.  She reports that she stubs it from time to time she cannot recall a specific injury associate when this pain began this occasionally swells occasionally gets red constant aching pain.  Assessment & Plan: Visit Diagnoses:  1. Toe pain, right   2. Pain of left heel   3. Plantar fasciitis     Plan: Depo-Medrol injection of her left plantar fascia.  Discussed conservative measures for plantar fasciitis discussed supportive shoe wear.  Of her right foot she does have some dislocation of the fourth toe we will discuss this with Dr. Lajoyce Corners and call her with her options.  Follow-Up Instructions: Return in about 4 weeks (around 03/06/2021), or if symptoms worsen or fail to improve.   Ortho Exam  Patient is alert, oriented, no adenopathy, well-dressed, normal affect, normal respiratory effort. On examination of the left foot the foot is plantigrade there is no swelling no discoloration she does have point tenderness to the origin of the plantar fascia on the left on examination of the right foot there  there is edema of the fourth toe particularly over the PIP joint this is where she is tender as well.  No obvious deformity.  is no ecchymosis no erythema  Imaging: No results found. No images are attached to the encounter.  Labs: No results found for: HGBA1C, ESRSEDRATE, CRP, LABURIC, REPTSTATUS, GRAMSTAIN, CULT, LABORGA   No results found for: ALBUMIN, PREALBUMIN, CBC  No results found for: MG No results found for: VD25OH  No results found for: PREALBUMIN CBC EXTENDED Latest Ref Rng & Units 09/15/2016  WBC 4.0 - 10.5 K/uL 7.7  RBC 3.87 - 5.11 MIL/uL 4.77  HGB 12.0 - 15.0 g/dL 41.7  HCT 40.8 - 14.4 % 42.7  PLT 150 - 400 K/uL 257     There is no height or weight on file to calculate BMI.  Orders:  Orders Placed This Encounter  Procedures  . XR Toe 4th Right   No orders of the defined types were placed in this encounter.    Procedures: No procedures performed  Clinical Data: No additional findings.  ROS:  All other systems negative, except as noted in the HPI. Review of Systems  Constitutional: Negative for chills and fever.  Musculoskeletal: Positive for arthralgias, gait problem and myalgias.    Objective: Vital Signs: There were no vitals taken for  this visit.  Specialty Comments:  No specialty comments available.  PMFS History: Patient Active Problem List   Diagnosis Date Noted  . Long QT interval 02/05/2019  . DOE (dyspnea on exertion) 12/25/2017  . PAF (paroxysmal atrial fibrillation) (HCC):  CHA2DS2-VASc Score 2 (Age 33, female) 11/21/2017   Past Medical History:  Diagnosis Date  . Dyslipidemia    On rosuvastatin, Krill oral  . Hypothyroidism due to Graves' disease status post radiation iodide    On levothyroxine  . Paroxysmal atrial fibrillation (HCC)    Rate controlled with diltiazem, rhythm control with flecainide. CHA2DS2Vasc = 2; on Eliquis    Family History  Problem Relation Age of Onset  . Breast cancer Neg Hx     Past Surgical  History:  Procedure Laterality Date  . CARDIAC EVENT MONITOR  11/2017   30 monitor: 98% normal sinus rhythm.  2% PAF with rates as high as 160 bpm.  . NM MYOVIEW LTD  12/2017   LOW RISK.  Walk for 9 minutes.  Peak heart rate 179 (116% of MPHR).  No EKG changes to suggest ischemia.  No ischemia or infarction noted on Myoview.  EF 65-70%.  . None    . TRANSTHORACIC ECHOCARDIOGRAM  11/28/2017   Normal LV size and function.  EF 60-65%.  Wall motion.  Mild AI, Mild-mod TR with mildly elevated PA pressures.   Social History   Occupational History  . Occupation: Runner, broadcasting/film/video    Comment: Retired  Tobacco Use  . Smoking status: Former Games developer  . Smokeless tobacco: Never Used  Substance and Sexual Activity  . Alcohol use: Yes    Comment: wine  . Drug use: No  . Sexual activity: Not on file

## 2021-02-09 DIAGNOSIS — N951 Menopausal and female climacteric states: Secondary | ICD-10-CM | POA: Diagnosis not present

## 2021-02-09 DIAGNOSIS — Z0000 Encounter for general adult medical examination without abnormal findings: Secondary | ICD-10-CM | POA: Diagnosis not present

## 2021-02-09 DIAGNOSIS — E039 Hypothyroidism, unspecified: Secondary | ICD-10-CM | POA: Diagnosis not present

## 2021-02-09 DIAGNOSIS — E559 Vitamin D deficiency, unspecified: Secondary | ICD-10-CM | POA: Diagnosis not present

## 2021-02-09 DIAGNOSIS — E7800 Pure hypercholesterolemia, unspecified: Secondary | ICD-10-CM | POA: Diagnosis not present

## 2021-02-09 DIAGNOSIS — I480 Paroxysmal atrial fibrillation: Secondary | ICD-10-CM | POA: Diagnosis not present

## 2021-02-09 DIAGNOSIS — Z1389 Encounter for screening for other disorder: Secondary | ICD-10-CM | POA: Diagnosis not present

## 2021-02-09 DIAGNOSIS — M8580 Other specified disorders of bone density and structure, unspecified site: Secondary | ICD-10-CM | POA: Diagnosis not present

## 2021-02-09 DIAGNOSIS — N183 Chronic kidney disease, stage 3 unspecified: Secondary | ICD-10-CM | POA: Diagnosis not present

## 2021-02-15 ENCOUNTER — Encounter: Payer: Self-pay | Admitting: Cardiology

## 2021-02-15 ENCOUNTER — Other Ambulatory Visit: Payer: Self-pay

## 2021-02-15 ENCOUNTER — Ambulatory Visit: Payer: Medicare PPO | Admitting: Cardiology

## 2021-02-15 VITALS — BP 136/68 | HR 52 | Ht 64.0 in | Wt 147.0 lb

## 2021-02-15 DIAGNOSIS — I480 Paroxysmal atrial fibrillation: Secondary | ICD-10-CM

## 2021-02-15 NOTE — Progress Notes (Signed)
Primary Care Provider: Maurice Small, MD Cardiologist: [name], MD Electrophysiologist: None  Clinic Note: Chief Complaint  Patient presents with  . Follow-up    ANNUAL  . Atrial Fibrillation    NO breakthrough episodes   ===================================  ASSESSMENT/PLAN   Problem List Items Addressed This Visit    PAF (paroxysmal atrial fibrillation) (HCC):  CHA2DS2-VASc Score 2 (Age 70, female) - Primary (Chronic)    She is doing very well with rhythm rhythm and rate control.  She would like to try to just simply go to pill in the pocket flecainide.  She does want a wait till after she completes the work on the extra house.  Once that is done, she would like to see if she could come off of flecainide and use it is simply pill in the pocket.  Plan: Continue with diltiazem for rate control and Eliquis for rhythm control.    When she is ready to wean off  Flecainide 50 mg:    Decrease flecainide to 25 mg ( 1/2 tablet of 50 mg) twice a day  for 2 weeks ->  then decrease to 25 mg  For 7 days ->  then stop taking  For breakthrough A. Fib: use Pill in a pocket - Flecainide  Take 200 mg dose  At that time then ->  About 12 hours later start taking 100 mg  twice a day until  rhythm breaks .  Once it breaks take 50 mg twice a day for 2 weeks then stop until next episode.      Relevant Orders   EKG 12-Lead (Completed)     ===================================  HPI:    Nicole Haynes is a 70 y.o. female with a PMH notable for paroxysmal A. fib managed with flecainide and diltiazem along with Eliquis who presents today for annual follow-up.  Nicole Haynes was last seen on 02/09/2020.  She was doing well only complaint was that she had gained some weight due to this isolation during the pandemic.  She was staying active, and was hoping to get out and do more activity.  She had not had any breakthrough episodes of A. fib.  Was doing well.  Recent  Hospitalizations: None  Reviewed  CV studies:    The following studies were reviewed today: (if available, images/films reviewed: From Epic Chart or Care Everywhere) . None:  Interval History:   Nicole Haynes returns here today again doing quite well.  No major issues.  No breakthrough episodes of A. fib.  She is a little worn out because they have been working hard on flipping her deceased brother-in-law's house.  She is been working extra hours trying to get this done.  A lot of manual labor.  But otherwise has not had any episodes whatsoever of A. fib.  No chest pain or pressure.  No heart failure symptoms.  She is a little leery of being on multiple medications and was hoping it may be she could potentially come off the flecainide.  She is wanting to give it a trial of using pill in the pocket PRN dosing.  CV Review of Symptoms (Summary) Cardiovascular ROS: no chest pain or dyspnea on exertion negative for - edema, irregular heartbeat, orthopnea, palpitations, paroxysmal nocturnal dyspnea, rapid heart rate, shortness of breath or Lightheadedness, dizziness or syncope/near syncope, TIA/amaurosis fugax or claudication.  The patient does not have symptoms concerning for COVID-19 infection (fever, chills, cough, or new shortness of breath).   REVIEWED  OF SYSTEMS   Review of Systems  Constitutional: Negative for malaise/fatigue (A little worn out because she has been working on flipping a house that was her brother-in-law's house.) and weight loss.  HENT: Negative for congestion and nosebleeds.   Respiratory: Negative for cough, hemoptysis and wheezing.   Gastrointestinal: Negative for blood in stool and melena.  Genitourinary: Negative for hematuria.  Musculoskeletal: Negative for joint pain.  Neurological: Negative for dizziness, focal weakness and headaches.  Psychiatric/Behavioral: Negative.    I have reviewed and (if needed) personally updated the patient's problem list,  medications, allergies, past medical and surgical history, social and family history.   PAST MEDICAL HISTORY   Past Medical History:  Diagnosis Date  . Dyslipidemia    On rosuvastatin, Krill oral  . Hypothyroidism due to Graves' disease status post radiation iodide    On levothyroxine  . Paroxysmal atrial fibrillation (HCC)    Rate controlled with diltiazem, rhythm control with flecainide. CHA2DS2Vasc = 2; on Eliquis    PAST SURGICAL HISTORY   Past Surgical History:  Procedure Laterality Date  . CARDIAC EVENT MONITOR  11/2017   30 monitor: 98% normal sinus rhythm.  2% PAF with rates as high as 160 bpm.  . NM MYOVIEW LTD  12/2017   LOW RISK.  Walk for 9 minutes.  Peak heart rate 179 (116% of MPHR).  No EKG changes to suggest ischemia.  No ischemia or infarction noted on Myoview.  EF 65-70%.  . None    . TRANSTHORACIC ECHOCARDIOGRAM  11/28/2017   Normal LV size and function.  EF 60-65%.  Wall motion.  Mild AI, Mild-mod TR with mildly elevated PA pressures.     There is no immunization history on file for this patient.  MEDICATIONS/ALLERGIES   Current Meds  Medication Sig  . Ascorbic Acid (VITAMIN C) 100 MG tablet Take 100 mg by mouth daily.  . Bacillus Coagulans-Inulin (PROBIOTIC) 1-250 BILLION-MG CAPS   . cholecalciferol (VITAMIN D3) 25 MCG (1000 UNIT) tablet 1 tablet  . diltiazem (CARDIZEM CD) 120 MG 24 hr capsule Take 1 capsule (120 mg total) by mouth daily.  Marland Kitchen. ELIQUIS 5 MG TABS tablet TAKE 1 TABLET BY MOUTH TWICE A DAY  . flecainide (TAMBOCOR) 50 MG tablet TAKE 1 TABLET (50 MG TOTAL) BY MOUTH 2 (TWO) TIMES DAILY. **NEEDS OV**  . levothyroxine (SYNTHROID) 88 MCG tablet TAKE 1 CAPSULE BY MOUTH EVERY DAY IN THE MORNING ON EMPTY STOMACH  . Magnesium 250 MG TABS Take 250 mg by mouth daily.  . riboflavin (VITAMIN B-2) 100 MG TABS tablet Take by mouth.  . rosuvastatin (CRESTOR) 5 MG tablet Take 5 mg by mouth daily.  . [DISCONTINUED] Calcium Carb-Cholecalciferol 500-400 MG-UNIT  TABS Take 400 mg by mouth 2 (two) times daily.    Allergies  Allergen Reactions  . Penicillins     SOCIAL HISTORY/FAMILY HISTORY   Reviewed in Epic:  Pertinent findings:  Social History   Tobacco Use  . Smoking status: Former Games developermoker  . Smokeless tobacco: Never Used  Substance Use Topics  . Alcohol use: Yes    Comment: wine  . Drug use: No   Social History   Social History Narrative   She is a married mother of 2.   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   She exercises about 3 days a week walking about 10 miles a week total.  She also enjoys yoga and routine hiking.  (Dedicated walking 60 minutes 2 days a week)  1-2 glasses of alcohol/wine per week.   Caffeine 2-1/2 cups daily.   Non-smoker but history of exposure to passive smoke.    OBJCTIVE -PE, EKG, labs   Wt Readings from Last 3 Encounters:  02/15/21 147 lb (66.7 kg)  02/09/20 154 lb (69.9 kg)  02/26/19 141 lb 9.6 oz (64.2 kg)    Physical Exam: BP 136/68   Pulse (!) 52   Ht 5\' 4"  (1.626 m)   Wt 147 lb (66.7 kg)   SpO2 97%   BMI 25.23 kg/m  Physical Exam Vitals reviewed.  Constitutional:      General: She is not in acute distress.    Appearance: Normal appearance. She is normal weight. She is not toxic-appearing.  HENT:     Head: Normocephalic and atraumatic.  Neck:     Vascular: No carotid bruit or JVD.  Cardiovascular:     Rate and Rhythm: Regular rhythm. Bradycardia present.  No extrasystoles are present.    Chest Wall: PMI is not displaced.     Pulses: Normal pulses.     Heart sounds: Normal heart sounds. No murmur heard. No friction rub. No gallop.   Pulmonary:     Effort: Pulmonary effort is normal. No respiratory distress.     Breath sounds: Normal breath sounds.  Chest:     Chest wall: No tenderness.  Musculoskeletal:        General: No swelling.     Cervical back: Normal range of motion and neck supple.  Skin:    General: Skin is warm and dry.  Neurological:      General: No focal deficit present.     Mental Status: She is alert and oriented to person, place, and time.  Psychiatric:        Mood and Affect: Mood normal.        Behavior: Behavior normal.        Thought Content: Thought content normal.        Judgment: Judgment normal.     Adult ECG Report  Rate: 52 ;  Rhythm: sinus bradycardia;   Narrative Interpretation: Normal axis, intervals and durations.  Recent Labs: 02/10/2021: TC 164, TG 1 1, HDL 67, LDL 78. Cr 1.13, K+ 4.0. TSH 1.56 No results found for: CHOL, HDL, LDLCALC, LDLDIRECT, TRIG, CHOLHDL Lab Results  Component Value Date   CREATININE 0.99 09/15/2016   BUN 18 09/15/2016   NA 141 09/15/2016   K 3.4 (L) 09/15/2016   CL 105 09/15/2016   CO2 25 09/15/2016   CBC Latest Ref Rng & Units 09/15/2016  WBC 4.0 - 10.5 K/uL 7.7  Hemoglobin 12.0 - 15.0 g/dL 09/17/2016  Hematocrit 53.7 - 46.0 % 42.7  Platelets 150 - 400 K/uL 257    No results found for: TSH  ==================================================  COVID-19 Education: The signs and symptoms of COVID-19 were discussed with the patient and how to seek care for testing (follow up with PCP or arrange E-visit).   The importance of social distancing and COVID-19 vaccination was discussed today. The patient is practicing social distancing & Masking.   I spent a total of 19 minutes with the patient spent in direct patient consultation.  Additional time spent with chart review  / charting (studies, outside notes, etc): 12 min Total Time: 31 min  Current medicines are reviewed at length with the patient today.  (+/- concerns) n/a  This visit occurred during the SARS-CoV-2 public health emergency.  Safety protocols were in place, including screening questions prior  to the visit, additional usage of staff PPE, and extensive cleaning of exam room while observing appropriate contact time as indicated for disinfecting solutions.  Notice: This dictation was prepared with Dragon dictation  along with smaller phrase technology. Any transcriptional errors that result from this process are unintentional and may not be corrected upon review.  Patient Instructions / Medication Changes & Studies & Tests Ordered   Patient Instructions  Medication Instructions:    When you are ready to wean off  Flecainide 50 mg Follow these instructions  Decrease flecainide to 25 mg ( 1/2 tablet of 50 mg) twice a day  for 2 weeks  then decrease to 25 mg  For 7 days then stop taking   If you develop  afib - use Pill in a pocket - Flecainide Take 200 mg dose  At that time then about 12 hours later start taking 100 mg  twice a day until  rhythm breaks . Once it breaks take 50 mg twice a day for 2 weeks then stop until next episode.   Contact office if this becomes for frequent.      *If you need a refill on your cardiac medications before your next appointment, please call your pharmacy*   Lab Work:  Not needed   Testing/Procedures:  Not needed Follow-Up: At [facility], you and your health needs are our priority.  As part of our continuing mission to provide you with exceptional heart care, we have created designated Provider Care Teams.  These Care Teams include your primary Cardiologist (physician) and Advanced Practice Providers (APPs -  Physician Assistants and Nurse Practitioners) who all work together to provide you with the care you need, when you need it.     Your next appointment:   4 month(s)  The format for your next appointment:   In Person  Provider:   [name], MD   Other Instructions    Studies Ordered:   Orders Placed This Encounter  Procedures  . EKG 12-Lead     [name], M.D., M.S. Interventional Cardiologist   Pager # [phone] Phone # [phone] [address] [place], [place] [address]   Thank you for choosing Heartcare at [facility]!!

## 2021-02-15 NOTE — Patient Instructions (Addendum)
Medication Instructions:    When you are ready to wean off  Flecainide 50 mg Follow these instructions  Decrease flecainide to 25 mg ( 1/2 tablet of 50 mg) twice a day  for 2 weeks  then decrease to 25 mg  For 7 days then stop taking   If you develop  afib - use Pill in a pocket - Flecainide Take 200 mg dose  At that time then about 12 hours later start taking 100 mg  twice a day until  rhythm breaks . Once it breaks take 50 mg twice a day for 2 weeks then stop until next episode.   Contact office if this becomes for frequent.      *If you need a refill on your cardiac medications before your next appointment, please call your pharmacy*   Lab Work:  Not needed   Testing/Procedures:  Not needed Follow-Up: At [facility], you and your health needs are our priority.  As part of our continuing mission to provide you with exceptional heart care, we have created designated Provider Care Teams.  These Care Teams include your primary Cardiologist (physician) and Advanced Practice Providers (APPs -  Physician Assistants and Nurse Practitioners) who all work together to provide you with the care you need, when you need it.     Your next appointment:   4 month(s)  The format for your next appointment:   In Person  Provider:   [name], MD   Other Instructions

## 2021-02-16 ENCOUNTER — Telehealth: Payer: Self-pay | Admitting: Cardiology

## 2021-02-16 NOTE — Telephone Encounter (Signed)
Left message for patient to call and schedule 4 month virtual follow up visit with Dr. Bryson Ha

## 2021-02-18 ENCOUNTER — Encounter: Payer: Self-pay | Admitting: Cardiology

## 2021-02-18 NOTE — Assessment & Plan Note (Signed)
She is doing very well with rhythm rhythm and rate control.  She would like to try to just simply go to pill in the pocket flecainide.  She does want a wait till after she completes the work on the extra house.  Once that is done, she would like to see if she could come off of flecainide and use it is simply pill in the pocket.  Plan: Continue with diltiazem for rate control and Eliquis for rhythm control.    When she is ready to wean off  Flecainide 50 mg:    Decrease flecainide to 25 mg ( 1/2 tablet of 50 mg) twice a day  for 2 weeks ->  then decrease to 25 mg  For 7 days ->  then stop taking  For breakthrough A. Fib: use Pill in a pocket - Flecainide  Take 200 mg dose  At that time then ->  About 12 hours later start taking 100 mg  twice a day until  rhythm breaks .  Once it breaks take 50 mg twice a day for 2 weeks then stop until next episode.

## 2021-03-07 ENCOUNTER — Other Ambulatory Visit: Payer: Self-pay | Admitting: Cardiology

## 2021-03-21 ENCOUNTER — Ambulatory Visit (INDEPENDENT_AMBULATORY_CARE_PROVIDER_SITE_OTHER): Payer: Medicare PPO | Admitting: Family

## 2021-03-21 ENCOUNTER — Other Ambulatory Visit: Payer: Self-pay

## 2021-03-21 ENCOUNTER — Ambulatory Visit: Payer: Medicare PPO | Admitting: Family

## 2021-03-21 ENCOUNTER — Ambulatory Visit: Payer: Medicare PPO | Admitting: Physician Assistant

## 2021-03-21 ENCOUNTER — Encounter: Payer: Self-pay | Admitting: Family

## 2021-03-21 DIAGNOSIS — M7662 Achilles tendinitis, left leg: Secondary | ICD-10-CM

## 2021-03-21 DIAGNOSIS — M5416 Radiculopathy, lumbar region: Secondary | ICD-10-CM | POA: Diagnosis not present

## 2021-03-21 DIAGNOSIS — M722 Plantar fascial fibromatosis: Secondary | ICD-10-CM

## 2021-03-21 MED ORDER — LIDOCAINE HCL 1 % IJ SOLN
2.0000 mL | INTRAMUSCULAR | Status: AC | PRN
  Administered 2021-03-21: 2 mL

## 2021-03-21 MED ORDER — METHYLPREDNISOLONE ACETATE 40 MG/ML IJ SUSP
40.0000 mg | INTRAMUSCULAR | Status: AC | PRN
  Administered 2021-03-21: 40 mg

## 2021-03-21 MED ORDER — PREDNISONE 50 MG PO TABS: ORAL_TABLET | ORAL | 0 refills | Status: DC

## 2021-03-21 NOTE — Progress Notes (Signed)
Office Visit Note   Patient: Nicole Haynes           Date of Birth: [birth year]           MRN: [id number] Visit Date: 03/21/2021              Requested by: Maurice Small, MD [address] [place],  [place] [address] PCP: Maurice Small, MD  Chief Complaint  Patient presents with  . Left Heel - Pain      HPI: The patient is a 70 year old woman who presents today in follow-up for left heel pain.  She did have injection for plantar fasciitis at her last appointment. The injection provided relief for a few weeks.  Since then she has obtained some Birkenstocks arch support and shoe inserts these have been providing modest relief.  Has continued rolling her heel on bottles of ice. she has been taking Tylenol.  She has gotten new shoes she has tried Ons.  Reports she typically wears sketchers.  Heel pain worst when she first wakes in the morning or gets up from prolonged rest.  This gradually eases with prolonged walking.  Has been having new pain in the posterior heel. This is along achilles and associated with tightness, states this radiates up through lower leg, to behind knee and even in posterior thigh. Denies back pain, denies numbness, tingling or weakness, no red flag symptoms.   Assessment & Plan: Visit Diagnoses:  1. Lumbar radiculopathy     Plan: Depo-Medrol injection of her left plantar fascia. Discussed will not repeat another injection in another 6 weeks. Discussed conservative measures for plantar fasciitis discussed supportive shoe wear.  Provided heel lift for achilles to rest this. Is unable to use anti-inflammatories due to Eliquis therapy.  We will trial her on a prednisone burst as strong presumption of lumbar radiculopathy.  Plan to follow-up in another 4 weeks Follow-Up Instructions: Return in about 4 weeks (around 04/18/2021), or if symptoms worsen or fail to improve.   Back Exam   Tenderness  The patient is experiencing no tenderness.    Muscle Strength  The patient has normal back strength.  Tests  Straight leg raise right: negative Straight leg raise left: negative      Patient is alert, oriented, no adenopathy, well-dressed, normal affect, normal respiratory effort. On examination of the left foot the foot is plantigrade there is no swelling no discoloration tenderness at the insertion of the Achilles as well.  There is no heel cord contracture, dorsiflexion 15 degrees past neutral.  She does have point tenderness to the origin of the plantar fascia on the left.  Imaging: No results found. No images are attached to the encounter.  Labs: No results found for: HGBA1C, ESRSEDRATE, CRP, LABURIC, REPTSTATUS, GRAMSTAIN, CULT, LABORGA   No results found for: ALBUMIN, PREALBUMIN, CBC  No results found for: MG No results found for: VD25OH  No results found for: PREALBUMIN CBC EXTENDED Latest Ref Rng & Units 09/15/2016  WBC 4.0 - 10.5 K/uL 7.7  RBC 3.87 - 5.11 MIL/uL 4.77  HGB 12.0 - 15.0 g/dL 38.7  HCT 56.4 - 33.2 % 42.7  PLT 150 - 400 K/uL 257     There is no height or weight on file to calculate BMI.  Orders:  Orders Placed This Encounter  Procedures  . Foot Inj   Meds ordered this encounter  Medications  . predniSONE (DELTASONE) 50 MG tablet    Sig: Take one tablet by  mouth once daily for 5 days.    Dispense:  5 tablet    Refill:  0     Procedures: Foot Inj  Date/Time: 03/21/2021 2:31 PM Performed by: Adonis Huguenin, NP Authorized by: Adonis Huguenin, NP   Consent Given by:  Patient Site marked: the procedure site was marked   Timeout: prior to procedure the correct patient, procedure, and site was verified   Indications:  Fasciitis and pain Condition: Plantar Fasciitis   Location: left plantar fascia muscle   Prep: patient was prepped and draped in usual sterile fashion   Needle Size:  22 G Medications:  2 mL lidocaine 1 %; 40 mg methylPREDNISolone acetate 40 MG/ML Patient Tolerance:   Patient tolerated the procedure well with no immediate complications    Clinical Data: No additional findings.  ROS:  All other systems negative, except as noted in the HPI. Review of Systems  Constitutional: Negative for chills and fever.  Musculoskeletal: Positive for arthralgias and myalgias.  Neurological: Negative for weakness and numbness.    Objective: Vital Signs: There were no vitals taken for this visit.  Specialty Comments:  No specialty comments available.  PMFS History: Patient Active Problem List   Diagnosis Date Noted  . Long QT interval 02/05/2019  . DOE (dyspnea on exertion) 12/25/2017  . PAF (paroxysmal atrial fibrillation) (HCC):  CHA2DS2-VASc Score 2 (Age 76, female) 11/21/2017   Past Medical History:  Diagnosis Date  . Dyslipidemia    On rosuvastatin, Krill oral  . Hypothyroidism due to Graves' disease status post radiation iodide    On levothyroxine  . Paroxysmal atrial fibrillation (HCC)    Rate controlled with diltiazem, rhythm control with flecainide. CHA2DS2Vasc = 2; on Eliquis    Family History  Problem Relation Age of Onset  . Breast cancer Neg Hx     Past Surgical History:  Procedure Laterality Date  . CARDIAC EVENT MONITOR  11/2017   30 monitor: 98% normal sinus rhythm.  2% PAF with rates as high as 160 bpm.  . NM MYOVIEW LTD  12/2017   LOW RISK.  Walk for 9 minutes.  Peak heart rate 179 (116% of MPHR).  No EKG changes to suggest ischemia.  No ischemia or infarction noted on Myoview.  EF 65-70%.  . None    . TRANSTHORACIC ECHOCARDIOGRAM  11/28/2017   Normal LV size and function.  EF 60-65%.  Wall motion.  Mild AI, Mild-mod TR with mildly elevated PA pressures.   Social History   Occupational History  . Occupation: Runner, broadcasting/film/video    Comment: Retired  Tobacco Use  . Smoking status: Former Games developer  . Smokeless tobacco: Never Used  Substance and Sexual Activity  . Alcohol use: Yes    Comment: wine  . Drug use: No  . Sexual activity:  Not on file

## 2021-05-08 ENCOUNTER — Ambulatory Visit: Payer: Medicare PPO | Admitting: Orthopedic Surgery

## 2021-05-08 ENCOUNTER — Encounter: Payer: Self-pay | Admitting: Orthopedic Surgery

## 2021-05-08 VITALS — Ht 64.0 in | Wt 147.0 lb

## 2021-05-08 DIAGNOSIS — M722 Plantar fascial fibromatosis: Secondary | ICD-10-CM | POA: Diagnosis not present

## 2021-05-09 ENCOUNTER — Encounter: Payer: Self-pay | Admitting: Orthopedic Surgery

## 2021-05-09 NOTE — Progress Notes (Signed)
Office Visit Note   Patient: Nicole Haynes           Date of Birth: 1951-04-23           MRN: [id number] Visit Date: 05/08/2021              Requested by: Maurice Small, MD [address] [place],  [place] [address] PCP: Maurice Small, MD  Chief Complaint  Patient presents with   Left Heel - Follow-up      HPI: Patient is a 70 year old woman who presents in follow-up for chronic plantar fasciitis on the left.  Patient states she has had some improvement still has pain has some numbness that goes down into all of her toes she has been using Tylenol.  Assessment & Plan: Visit Diagnoses:  1. Plantar fasciitis     Plan: Recommended continue with Achilles stretching use a heel lift for ADLs to unload the plantar fascia recommended a spike ball massage and fascial strengthening.  Follow-Up Instructions: Return if symptoms worsen or fail to improve.   Ortho Exam  Patient is alert, oriented, no adenopathy, well-dressed, normal affect, normal respiratory effort. Examination patient has good pulses good ankle good subtalar motion she has dorsiflexion of the ankle 20 degrees past neutral.  Lateral compression of the calcaneus is nontender tarsal tunnel is nontender to palpation.  She does have pain reproduced with palpation over the origin of the plantar fascia.  Imaging: No results found. No images are attached to the encounter.  Labs: No results found for: HGBA1C, ESRSEDRATE, CRP, LABURIC, REPTSTATUS, GRAMSTAIN, CULT, LABORGA   No results found for: ALBUMIN, PREALBUMIN, CBC  No results found for: MG No results found for: VD25OH  No results found for: PREALBUMIN CBC EXTENDED Latest Ref Rng & Units 09/15/2016  WBC 4.0 - 10.5 K/uL 7.7  RBC 3.87 - 5.11 MIL/uL 4.77  HGB 12.0 - 15.0 g/dL 32.2  HCT 02.5 - 42.7 % 42.7  PLT 150 - 400 K/uL 257     Body mass index is 25.23 kg/m.  Orders:  No orders of the defined types were placed in this  encounter.  No orders of the defined types were placed in this encounter.    Procedures: No procedures performed  Clinical Data: No additional findings.  ROS:  All other systems negative, except as noted in the HPI. Review of Systems  Objective: Vital Signs: Ht 5\' 4"  (1.626 m)   Wt 147 lb (66.7 kg)   BMI 25.23 kg/m   Specialty Comments:  No specialty comments available.  PMFS History: Patient Active Problem List   Diagnosis Date Noted   Long QT interval 02/05/2019   DOE (dyspnea on exertion) 12/25/2017   PAF (paroxysmal atrial fibrillation) (HCC):  CHA2DS2-VASc Score 2 (Age 78, female) 11/21/2017   Past Medical History:  Diagnosis Date   Dyslipidemia    On rosuvastatin, Krill oral   Hypothyroidism due to Graves' disease status post radiation iodide    On levothyroxine   Paroxysmal atrial fibrillation (HCC)    Rate controlled with diltiazem, rhythm control with flecainide. CHA2DS2Vasc = 2; on Eliquis    Family History  Problem Relation Age of Onset   Breast cancer Neg Hx     Past Surgical History:  Procedure Laterality Date   CARDIAC EVENT MONITOR  11/2017   30 monitor: 98% normal sinus rhythm.  2% PAF with rates as high as 160 bpm.   NM MYOVIEW LTD  12/2017   LOW RISK.  Walk for 9 minutes.  Peak heart rate 179 (116% of MPHR).  No EKG changes to suggest ischemia.  No ischemia or infarction noted on Myoview.  EF 65-70%.   None     TRANSTHORACIC ECHOCARDIOGRAM  11/28/2017   Normal LV size and function.  EF 60-65%.  Wall motion.  Mild AI, Mild-mod TR with mildly elevated PA pressures.   Social History   Occupational History   Occupation: Runner, broadcasting/film/video    Comment: Retired  Tobacco Use   Smoking status: Former   Smokeless tobacco: Never  Substance and Sexual Activity   Alcohol use: Yes    Comment: wine   Drug use: No   Sexual activity: Not on file

## 2021-05-24 DIAGNOSIS — H04123 Dry eye syndrome of bilateral lacrimal glands: Secondary | ICD-10-CM | POA: Diagnosis not present

## 2021-05-24 DIAGNOSIS — H47323 Drusen of optic disc, bilateral: Secondary | ICD-10-CM | POA: Diagnosis not present

## 2021-05-24 DIAGNOSIS — H25813 Combined forms of age-related cataract, bilateral: Secondary | ICD-10-CM | POA: Diagnosis not present

## 2021-05-24 DIAGNOSIS — H524 Presbyopia: Secondary | ICD-10-CM | POA: Diagnosis not present

## 2021-06-13 DIAGNOSIS — L570 Actinic keratosis: Secondary | ICD-10-CM | POA: Diagnosis not present

## 2021-06-13 DIAGNOSIS — D2239 Melanocytic nevi of other parts of face: Secondary | ICD-10-CM | POA: Diagnosis not present

## 2021-06-14 ENCOUNTER — Other Ambulatory Visit: Payer: Self-pay | Admitting: Cardiology

## 2021-06-14 NOTE — Telephone Encounter (Signed)
Prescription refill request for Eliquis received. Indication:afib Last office visit:harding 02/15/21 Scr:1.130 mg/ 02/09/2021 Age: 89f Weight:66.7kg

## 2021-06-14 NOTE — Telephone Encounter (Signed)
Please review for refill. Thank you! 

## 2021-06-19 ENCOUNTER — Telehealth: Payer: Self-pay | Admitting: *Deleted

## 2021-06-19 NOTE — Telephone Encounter (Signed)
Left message for patient to call back -would like to switch appointment to in person rather than virtual 06/28/21 with Dr Harding . 

## 2021-06-28 ENCOUNTER — Ambulatory Visit: Payer: Medicare PPO | Admitting: Cardiology

## 2021-06-28 ENCOUNTER — Encounter: Payer: Self-pay | Admitting: Cardiology

## 2021-06-28 ENCOUNTER — Other Ambulatory Visit: Payer: Self-pay

## 2021-06-28 VITALS — BP 124/64 | HR 50 | Ht 64.0 in | Wt 146.8 lb

## 2021-06-28 DIAGNOSIS — I480 Paroxysmal atrial fibrillation: Secondary | ICD-10-CM

## 2021-06-28 DIAGNOSIS — Z7901 Long term (current) use of anticoagulants: Secondary | ICD-10-CM

## 2021-06-28 DIAGNOSIS — R0600 Dyspnea, unspecified: Secondary | ICD-10-CM | POA: Diagnosis not present

## 2021-06-28 DIAGNOSIS — R0609 Other forms of dyspnea: Secondary | ICD-10-CM

## 2021-06-28 MED ORDER — FLECAINIDE ACETATE 50 MG PO TABS: ORAL_TABLET | ORAL | 6 refills | Status: DC

## 2021-06-28 MED ORDER — DILTIAZEM HCL ER COATED BEADS 120 MG PO CP24: ORAL_CAPSULE | ORAL | 6 refills | Status: DC

## 2021-06-28 NOTE — Patient Instructions (Addendum)
Medication Instructions:    Wean off Diltiazem every other day for a week then stop.   You will use the Diltiazem 120 mg every day as needed if you use flecainide for breakthrough  once you stop taking flecainide then you stop the Diltiazem    If you develop  afib - use Pill in a pocket - Flecainide Take 200 mg dose  At that time then about 12 hours later start taking 100 mg  twice a day until  rhythm breaks . Once it breaks take 50 mg twice a day for 2 weeks then stop until next episode.   *If you need a refill on your cardiac medications before your next appointment, please call your pharmacy*   Lab Work: Not needed    Testing/Procedures:  Not needed  Follow-Up: At [facility], you and your health needs are our priority.  As part of our continuing mission to provide you with exceptional heart care, we have created designated Provider Care Teams.  These Care Teams include your primary Cardiologist (physician) and Advanced Practice Providers (APPs -  Physician Assistants and Nurse Practitioners) who all work together to provide you with the care you need, when you need it.     Your next appointment:   6 month(s)  The format for your next appointment:   In Person  Provider:   [name], MD

## 2021-06-28 NOTE — Progress Notes (Signed)
Primary Care Provider: Maurice Small, MD Cardiologist: [name], MD Electrophysiologist: None  Clinic Note: Chief Complaint  Patient presents with   Follow-up    Doing well.  Just notes a little bit of fatigue   Atrial Fibrillation    No recurrent episodes.  Did well off of flecainide.     ===================================  ASSESSMENT/PLAN   Problem List Items Addressed This Visit       Cardiology Problems   PAF (paroxysmal atrial fibrillation) (HCC):  CHA2DS2-VASc Score 2 (Age 71, female) - Primary (Chronic)    Doing well.  Maintaining sinus rhythm on diltiazem alone, however she remains bradycardic and has having some exercise fatigue and is concerned about medications. No bleeding issues on Eliquis.  Plan: Wean off of diltiazem-go to every other day for 1 week and then stop. PRN dosing for breakthrough spells would be diltiazem 120 mg daily along with the flecainide pill in the pocket technique.  She will take it every day until out of A. fib and off of flecainide, then stop when flecainide stopped Pill in the pocket plan for flecainide: 200 mg x 1, then 100 mg twice daily starting 12 hours after first dose.  Once the rhythm breaks, flecainide 50 mg twice daily x2 weeks Continue Eliquis for anticoagulation.      Relevant Medications   diltiazem (CARDIZEM CD) 120 MG 24 hr capsule   flecainide (TAMBOCOR) 50 MG tablet     Other   DOE (dyspnea on exertion) (Chronic)    Negative ischemic work-up.  Could be that she is having some exercise intolerance.  We will wean off diltiazem to see if this helps.      Current use of long term anticoagulation (Chronic)    On Eliquis Chads Vascor with hypertension and female is a least 2.       ===================================  HPI:    Nicole Haynes is a 70 y.o. female with a PMH notable for PAF (CHA2DS2-VASc score 2-Eliquis;on rate control diltiazem with pill in the pocket flecainide) who presents today for  20-month follow-up.  Nicole Haynes was last seen on February 15, 2021--we converted from standing dose flecainide to pill in the pocket.  She weaned off and then plan was 200 mg x 1 followed by 100 mg twice daily until it breaks and then 50 mg twice daily for 2 weeks.  Recent Hospitalizations: None  Reviewed  CV studies:    The following studies were reviewed today: (if available, images/films reviewed: From Epic Chart or Care Everywhere) None:  Interval History:   Nicole Haynes returns today doing well.  No real breakthrough spells of A. fib.  She just notes feeling little bit more tired than usual.  She has not really been sleeping very well and that may be part of the cause.  She says that she has not any breakthrough since stopping the flecainide, actually felt better overall.  She is mostly bothered by Planter fasciitis that keeps her from walking very much. Other than some mild burning sensation across the chest that occurs every now and then, not really having any significant symptoms of chest pain or pressure.  No recurrent episodes of tachycardia or arrhythmia.  No CHF symptoms.  CV Review of Symptoms (Summary) Cardiovascular ROS: no chest pain or dyspnea on exertion positive for - feels a little bit tired - also notes waking up a lot @ night.  Concerned that CCB causing fatiue; she says off-and-on burning sensation across the chest,  but no real chest pain negative for - edema, irregular heartbeat, orthopnea, palpitations, paroxysmal nocturnal dyspnea, rapid heart rate, shortness of breath, or lightheadedness, dizziness or wooziness, syncope/near syncope TIA/amaurosis fugax.  REVIEWED OF SYSTEMS   Review of Systems  Constitutional:  Positive for malaise/fatigue. Negative for weight loss.  HENT:  Negative for congestion and nosebleeds.   Respiratory:  Negative for cough and shortness of breath.   Cardiovascular:  Negative for leg swelling.  Gastrointestinal:  Negative  for blood in stool, diarrhea and melena.  Genitourinary:  Negative for hematuria.  Musculoskeletal:  Negative for joint pain.       Pain, seems to be doing better.  Related to plantar fasciitis  Neurological: Negative.   Psychiatric/Behavioral: Negative.     I have reviewed and (if needed) personally updated the patient's problem list, medications, allergies, past medical and surgical history, social and family history.   PAST MEDICAL HISTORY   Past Medical History:  Diagnosis Date   Dyslipidemia    On rosuvastatin, Krill oral   Hypothyroidism due to Graves' disease status post radiation iodide    On levothyroxine   Paroxysmal atrial fibrillation (HCC)    Rate controlled with diltiazem, rhythm control with flecainide. CHA2DS2Vasc = 2; on Eliquis    PAST SURGICAL HISTORY   Past Surgical History:  Procedure Laterality Date   CARDIAC EVENT MONITOR  11/2017   30 monitor: 98% normal sinus rhythm.  2% PAF with rates as high as 160 bpm.   NM MYOVIEW LTD  12/2017   LOW RISK.  Walk for 9 minutes.  Peak heart rate 179 (116% of MPHR).  No EKG changes to suggest ischemia.  No ischemia or infarction noted on Myoview.  EF 65-70%.   None     TRANSTHORACIC ECHOCARDIOGRAM  11/28/2017   Normal LV size and function.  EF 60-65%.  Wall motion.  Mild AI, Mild-mod TR with mildly elevated PA pressures.    There is no immunization history on file for this patient.  MEDICATIONS/ALLERGIES   Current Meds  Medication Sig   Bacillus Coagulans-Inulin (PROBIOTIC) 1-250 BILLION-MG CAPS    ELIQUIS 5 MG TABS tablet TAKE 1 TABLET BY MOUTH TWICE A DAY   flecainide (TAMBOCOR) 50 MG tablet If you develop  afib -  Flecainide Take 200 mg dose  At that time then about 12 hours later start taking 100 mg  twice a day until  rhythm breaks . Once it breaks take 50 mg twice a day for 2 weeks then stop until next episode. AS NEEDED   levothyroxine (SYNTHROID) 88 MCG tablet TAKE 1 CAPSULE BY MOUTH EVERY DAY IN THE  MORNING ON EMPTY STOMACH   rosuvastatin (CRESTOR) 5 MG tablet Take 5 mg by mouth daily.   [DISCONTINUED] Ascorbic Acid (VITAMIN C) 100 MG tablet Take 100 mg by mouth daily.   [DISCONTINUED] cholecalciferol (VITAMIN D3) 25 MCG (1000 UNIT) tablet 1 tablet   [DISCONTINUED] diltiazem (CARDIZEM CD) 120 MG 24 hr capsule TAKE 1 CAPSULE BY MOUTH EVERY DAY   [DISCONTINUED] Magnesium 250 MG TABS Take 250 mg by mouth daily.    Allergies  Allergen Reactions   Penicillins     SOCIAL HISTORY/FAMILY HISTORY   Reviewed in Epic:  Pertinent findings:  Social History   Tobacco Use   Smoking status: Former   Smokeless tobacco: Never  Substance Use Topics   Alcohol use: Yes    Comment: wine   Drug use: No   Social History   Social History Narrative  She is a married mother of 2.   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   She exercises about 3 days a week walking about 10 miles a week total.  She also enjoys yoga and routine hiking.  (Dedicated walking 60 minutes 2 days a week)   1-2 glasses of alcohol/wine per week.   Caffeine 2-1/2 cups daily.   Non-smoker but history of exposure to passive smoke.    OBJCTIVE -PE, EKG, labs   Wt Readings from Last 3 Encounters:  06/28/21 146 lb 12.8 oz (66.6 kg)  05/08/21 147 lb (66.7 kg)  02/15/21 147 lb (66.7 kg)    Physical Exam: BP 124/64   Pulse (!) 50   Ht 5\' 4"  (1.626 m)   Wt 146 lb 12.8 oz (66.6 kg)   SpO2 98%   BMI 25.20 kg/m  Physical Exam Vitals reviewed.  Constitutional:      General: She is not in acute distress.    Appearance: Normal appearance. She is normal weight. She is not ill-appearing (Healthy-appearing) or toxic-appearing.     Comments: Well-groomed.  Well-nourished.  HENT:     Head: Normocephalic and atraumatic.  Neck:     Vascular: No carotid bruit or JVD.  Cardiovascular:     Rate and Rhythm: Regular rhythm. Bradycardia present. No extrasystoles are present.    Chest Wall: PMI is not displaced.      Pulses: Normal pulses. No decreased pulses.     Heart sounds: Normal heart sounds. No murmur heard.   No friction rub. No gallop.  Pulmonary:     Effort: Pulmonary effort is normal. No respiratory distress.     Breath sounds: Normal breath sounds. No wheezing, rhonchi or rales.  Chest:     Chest wall: No tenderness.  Musculoskeletal:        General: No swelling. Normal range of motion.     Cervical back: Normal range of motion and neck supple.  Skin:    General: Skin is warm and dry.  Neurological:     General: No focal deficit present.     Mental Status: She is alert and oriented to person, place, and time.     Cranial Nerves: No cranial nerve deficit.     Gait: Gait normal.  Psychiatric:        Mood and Affect: Mood normal.        Behavior: Behavior normal.        Thought Content: Thought content normal.        Judgment: Judgment normal.     Adult ECG Report Not checked  Recent Labs: No recent labs available No results found for: CHOL, HDL, LDLCALC, LDLDIRECT, TRIG, CHOLHDL Lab Results  Component Value Date   CREATININE 0.99 09/15/2016   BUN 18 09/15/2016   NA 141 09/15/2016   K 3.4 (L) 09/15/2016   CL 105 09/15/2016   CO2 25 09/15/2016   CBC Latest Ref Rng & Units 09/15/2016  WBC 4.0 - 10.5 K/uL 7.7  Hemoglobin 12.0 - 15.0 g/dL 09/17/2016  Hematocrit 27.2 - 46.0 % 42.7  Platelets 150 - 400 K/uL 257    No results found for: HGBA1C No results found for: TSH  ==================================================  COVID-19 Education: The signs and symptoms of COVID-19 were discussed with the patient and how to seek care for testing (follow up with PCP or arrange E-visit).    I spent a total of 22 minutes with the patient spent in direct patient consultation.  Additional  time spent with chart review  / charting (studies, outside notes, etc): 16 min Total Time: 38 min  Current medicines are reviewed at length with the patient today.  (+/- concerns) none  This  visit occurred during the SARS-CoV-2 public health emergency.  Safety protocols were in place, including screening questions prior to the visit, additional usage of staff PPE, and extensive cleaning of exam room while observing appropriate contact time as indicated for disinfecting solutions.  Notice: This dictation was prepared with Dragon dictation along with smaller phrase technology. Any transcriptional errors that result from this process are unintentional and may not be corrected upon review.  Patient Instructions / Medication Changes & Studies & Tests Ordered   Patient Instructions  Medication Instructions:    Wean off Diltiazem every other day for a week then stop.   You will use the Diltiazem 120 mg every day as needed if you use flecainide for breakthrough  once you stop taking flecainide then you stop the Diltiazem    If you develop  afib - use Pill in a pocket - Flecainide Take 200 mg dose  At that time then about 12 hours later start taking 100 mg  twice a day until  rhythm breaks . Once it breaks take 50 mg twice a day for 2 weeks then stop until next episode.   *If you need a refill on your cardiac medications before your next appointment, please call your pharmacy*   Lab Work: Not needed    Testing/Procedures:  Not needed  Follow-Up: At [facility], you and your health needs are our priority.  As part of our continuing mission to provide you with exceptional heart care, we have created designated Provider Care Teams.  These Care Teams include your primary Cardiologist (physician) and Advanced Practice Providers (APPs -  Physician Assistants and Nurse Practitioners) who all work together to provide you with the care you need, when you need it.     Your next appointment:   6 month(s)  The format for your next appointment:   In Person  Provider:   [name], MD     Studies Ordered:   No orders of the defined types were placed in this  encounter.    [name], M.D., M.S. Interventional Cardiologist   Pager # [phone] Phone # [phone] [address] [place], [place] [address]   Thank you for choosing Heartcare at [facility]!!

## 2021-07-07 ENCOUNTER — Encounter: Payer: Self-pay | Admitting: Cardiology

## 2021-07-07 DIAGNOSIS — I480 Paroxysmal atrial fibrillation: Secondary | ICD-10-CM | POA: Insufficient documentation

## 2021-07-07 DIAGNOSIS — Z7901 Long term (current) use of anticoagulants: Secondary | ICD-10-CM | POA: Insufficient documentation

## 2021-07-07 NOTE — Assessment & Plan Note (Addendum)
Doing well.  Maintaining sinus rhythm on diltiazem alone, however she remains bradycardic and has having some exercise fatigue and is concerned about medications. No bleeding issues on Eliquis.  Plan:  Wean off of diltiazem-go to every other day for 1 week and then stop.  PRN dosing for breakthrough spells would be diltiazem 120 mg daily along with the flecainide pill in the pocket technique.  She will take it every day until out of A. fib and off of flecainide, then stop when flecainide stopped  Pill in the pocket plan for flecainide: 200 mg x 1, then 100 mg twice daily starting 12 hours after first dose.  Once the rhythm breaks, flecainide 50 mg twice daily x2 weeks  Continue Eliquis for anticoagulation.

## 2021-07-07 NOTE — Assessment & Plan Note (Signed)
Negative ischemic work-up.  Could be that she is having some exercise intolerance.  We will wean off diltiazem to see if this helps.

## 2021-07-07 NOTE — Assessment & Plan Note (Signed)
On Eliquis Chads Vascor with hypertension and female is a least 2.

## 2021-08-23 DIAGNOSIS — E7800 Pure hypercholesterolemia, unspecified: Secondary | ICD-10-CM | POA: Diagnosis not present

## 2021-08-23 DIAGNOSIS — Z8616 Personal history of COVID-19: Secondary | ICD-10-CM | POA: Diagnosis not present

## 2021-08-23 DIAGNOSIS — E039 Hypothyroidism, unspecified: Secondary | ICD-10-CM | POA: Diagnosis not present

## 2021-08-23 DIAGNOSIS — I480 Paroxysmal atrial fibrillation: Secondary | ICD-10-CM | POA: Diagnosis not present

## 2021-09-20 ENCOUNTER — Encounter: Payer: Self-pay | Admitting: Cardiology

## 2021-09-21 NOTE — Telephone Encounter (Signed)
Sure to be She can come in and get an EKG check.  I think the heartburn significantly may be from taking the flecainide as well.  Is quite possible that the disturbance of her system with COVID could have potentially triggered going to A. fib.  Is also just possible that it happened spontaneously.    May be due to combination diltiazem and flecainide for about a week to settle things down.  But lets get an EKG this week or early next week.  [name], MD

## 2021-09-25 ENCOUNTER — Other Ambulatory Visit: Payer: Self-pay | Admitting: Family Medicine

## 2021-09-25 DIAGNOSIS — Z1231 Encounter for screening mammogram for malignant neoplasm of breast: Secondary | ICD-10-CM

## 2021-09-25 NOTE — Telephone Encounter (Signed)
Spoke to patient . Patient is not sure  if she is still in  afib or not. She states she has a fit bit - sometimes her heart rate went up to the 160 's.  She did follow instruction Dr Herbie Baltimore had given her  in regards to pill in a pocket - diltiazem and flecainide.  Patient will come to the office on 09/26/21 @ 3 pm  for EKG to verify if she is in Afib or not.Marland Kitchen

## 2021-09-26 ENCOUNTER — Ambulatory Visit (INDEPENDENT_AMBULATORY_CARE_PROVIDER_SITE_OTHER): Payer: Medicare PPO | Admitting: *Deleted

## 2021-09-26 ENCOUNTER — Other Ambulatory Visit: Payer: Self-pay

## 2021-09-26 VITALS — HR 58 | Wt 145.2 lb

## 2021-09-26 DIAGNOSIS — I480 Paroxysmal atrial fibrillation: Secondary | ICD-10-CM

## 2021-09-26 NOTE — Progress Notes (Signed)
Reason for visit:  ekg only   Name of MD requesting visit: Dr Herbie Baltimore  H&P:n/a  ROS related to problem:  patient recently called in to the office states since she had covid she has couple episodes  heart racing , like she was afib. Patient states she followed Dr Herbie Baltimore instruction about using a pill in the pocket - flecainide  Assessment and plan per MD:  EKG obtained - heart rate 58 . EkG reviewed by Dr Allyson Sabal D.O.D.  Patient is aware to continue with plan per Dr Herbie Baltimore last instructions for Paroxysmal atrial fibrillation  You will use the Diltiazem 120 mg every day as needed if you use flecainide for breakthrough  once you stop taking flecainide then you stop the Diltiazem     If you develop  afib - use Pill in a pocket - Flecainide Take 200 mg dose  At that time then about 12 hours later start taking 100 mg  twice a day until  rhythm breaks . Once it breaks take 50 mg twice a day for 2 weeks then stop until next episode.

## 2021-10-31 ENCOUNTER — Encounter: Payer: Self-pay | Admitting: Cardiology

## 2021-10-31 DIAGNOSIS — R350 Frequency of micturition: Secondary | ICD-10-CM | POA: Diagnosis not present

## 2021-12-03 ENCOUNTER — Ambulatory Visit: Payer: Medicare PPO

## 2021-12-04 ENCOUNTER — Other Ambulatory Visit: Payer: Self-pay

## 2021-12-04 ENCOUNTER — Ambulatory Visit
Admission: RE | Admit: 2021-12-04 | Discharge: 2021-12-04 | Disposition: A | Discharge status: Home / Self Care | Payer: Medicare PPO | Source: Ambulatory Visit | Attending: Family Medicine | Admitting: Family Medicine

## 2021-12-04 ENCOUNTER — Encounter: Payer: Self-pay | Admitting: Cardiology

## 2021-12-04 DIAGNOSIS — Z23 Encounter for immunization: Secondary | ICD-10-CM | POA: Diagnosis not present

## 2021-12-04 DIAGNOSIS — L814 Other melanin hyperpigmentation: Secondary | ICD-10-CM | POA: Diagnosis not present

## 2021-12-04 DIAGNOSIS — Z1231 Encounter for screening mammogram for malignant neoplasm of breast: Secondary | ICD-10-CM

## 2021-12-04 DIAGNOSIS — D225 Melanocytic nevi of trunk: Secondary | ICD-10-CM | POA: Diagnosis not present

## 2021-12-04 DIAGNOSIS — L821 Other seborrheic keratosis: Secondary | ICD-10-CM | POA: Diagnosis not present

## 2021-12-04 DIAGNOSIS — L578 Other skin changes due to chronic exposure to nonionizing radiation: Secondary | ICD-10-CM | POA: Diagnosis not present

## 2021-12-04 DIAGNOSIS — L570 Actinic keratosis: Secondary | ICD-10-CM | POA: Diagnosis not present

## 2021-12-04 MED ORDER — DILTIAZEM HCL ER COATED BEADS 120 MG PO CP24: ORAL_CAPSULE | ORAL | 6 refills | Status: DC

## 2021-12-04 MED ORDER — FLECAINIDE ACETATE 50 MG PO TABS: ORAL_TABLET | ORAL | 6 refills | Status: DC

## 2021-12-15 ENCOUNTER — Other Ambulatory Visit: Payer: Self-pay | Admitting: Cardiology

## 2021-12-17 NOTE — Telephone Encounter (Signed)
Prescription refill request for Eliquis received. Indication:Afib Last office visit:12/22 Scr:1.1 Age: 71 Weight:65.9 kg  Prescription refilled

## 2022-01-18 ENCOUNTER — Encounter: Payer: Self-pay | Admitting: Cardiology

## 2022-01-18 ENCOUNTER — Ambulatory Visit: Payer: Medicare PPO | Admitting: Cardiology

## 2022-01-18 VITALS — BP 128/62 | HR 52 | Ht 64.0 in | Wt 152.0 lb

## 2022-01-18 DIAGNOSIS — Z7901 Long term (current) use of anticoagulants: Secondary | ICD-10-CM | POA: Diagnosis not present

## 2022-01-18 DIAGNOSIS — D6869 Other thrombophilia: Secondary | ICD-10-CM | POA: Diagnosis not present

## 2022-01-18 DIAGNOSIS — I480 Paroxysmal atrial fibrillation: Secondary | ICD-10-CM | POA: Diagnosis not present

## 2022-01-18 NOTE — Patient Instructions (Addendum)
Medication Instructions:  ?Decrease Flecainide take 1/2 tablet ( 25 mg ) twice a day ?Continue all other medications ?*If you need a refill on your cardiac medications before your next appointment, please call your pharmacy* ? ? ?Lab Work: ?Cbc,cmet today ? ? ?Testing/Procedures: ?None ordered ? ? ?Follow-Up: ?At [facility], you and your health needs are our priority.  As part of our continuing mission to provide you with exceptional heart care, we have created designated Provider Care Teams.  These Care Teams include your primary Cardiologist (physician) and Advanced Practice Providers (APPs -  Physician Assistants and Nurse Practitioners) who all work together to provide you with the care you need, when you need it. ? ?We recommend signing up for the patient portal called "MyChart".  Sign up information is provided on this After Visit Summary.  MyChart is used to connect with patients for Virtual Visits (Telemedicine).  Patients are able to view lab/test results, encounter notes, upcoming appointments, etc.  Non-urgent messages can be sent to your provider as well.   ?To learn more about what you can do with MyChart, go to [email or web address].   ? ?Your next appointment:  6 months ?  ? ?The format for your next appointment: Office  ? ? ?Provider:  Dr.Harding ? ? ?

## 2022-01-18 NOTE — Progress Notes (Addendum)
? ?. ?Primary Care Provider: Kelton Pillar, MD ?Cardiologist: Glenetta Hew, MD ?Electrophysiologist: None ? ?Clinic Note: ?Chief Complaint  ?Patient presents with  ? Follow-up  ?  6 months  ? Atrial Fibrillation  ?  Breakthrough spell in December noted.  Broke with pill in the pocket but now she is on maintenance dose again.  ? ? ?=================================== ? ?ASSESSMENT/PLAN  ? ?Problem List Items Addressed This Visit   ? ?  ? Cardiology Problems  ? Hypercoagulable state due to paroxysmal atrial fibrillation (HCC) (Chronic)  ?  CHA2DS2-VASc score is 2. ? ?Stable on Eliquis.  No bleeding issues. ? ?Okay to hold Eliquis 48 to 72 hours preop for surgeries or procedures.  (72 hours for more high risk procedures) ?  ?  ? Relevant Medications  ? flecainide (TAMBOCOR) 50 MG tablet  ? Other Relevant Orders  ? EKG 12-Lead  ? Comprehensive Metabolic Panel (CMET) (Completed)  ? CBC w/Diff/Platelet (Completed)  ? PAF (paroxysmal atrial fibrillation) ([place]):  CHA2DS2-VASc Score 2 (Age 42, female) - Primary (Chronic)  ?  PAF, CHA2DS2-VASc Score 2  ?Breakthrough A Fib event in December while off of antinodal and antiarrhythmic agents, restarted on diltiazem and flecainide pill in pocket method. Since then doing well, maintaining sinus rhythm but has continued to take diltiazem and flecainide. Having more fatigue with sinus bradycardia and 1st degree AV block on EKG today. No bleeding issues. ?- decrease flecainide to 25mg  BID (1/2 tab) ?- continue diltiazem - while on standing dose flecainide. ?- continue Eliquis for anticoagulation ?- check CBC and metabolic panel ?- consider weaning off flecainide at follow-up if doing well ? ?This was an complicated scenario because she was doing really well on simple pill in pocket technique.  I think this episode she had in December scared her.  For now we will just do maintenance dose for little bit longer and then potentially wean back off. ?  ?  ? Relevant Medications  ?  flecainide (TAMBOCOR) 50 MG tablet  ? Other Relevant Orders  ? EKG 12-Lead  ? Comprehensive Metabolic Panel (CMET) (Completed)  ? CBC w/Diff/Platelet (Completed)  ? ? ? ?=================================== ? ?HPI:   ? ?Nicole Haynes is a 71 y.o. female with a PMH notable for PAF (CHA2DS2-VASc score 2) who presents today for follow-up for 28-month follow-up with recent breakthrough spell.. ? ?Nicole Haynes was last seen on 06/28/2021.  We have previously gone from STEMI dorsiflexion to pill in the pocket flecainide.  At this visit, she had not had any breakthrough spells.  Is not sleeping very well and feeling more tired than usual.  She felt better overall off of flecainide.  Biggest complaint was plantar fasciitis. ?We actually weaned off standing dose diltiazem and added PRN diltiazem to the breakthrough pill in the pocket technique.  Taking diltiazem along with flecainide.  Taking flecainide, she will take diltiazem and then 1 week recommend wean off diltiazem. ? ?Recent Hospitalizations: none ? ?Reviewed  CV studies:   ? ?The following studies were reviewed today: (if available, images/films reviewed: From Epic Chart or Care Everywhere) ?No new studies: ? ?Interval History:  ? ?Nicole Haynes had recurrence of atrial fibrillation in December 2022 confirmed by RN visit for EKG. She was given instructions on using pill in the pocket with flecainide and diltiazem. ? ?Today, patient states her episode of A Fib in December may have been triggered by stress as she was about to travel to [place]. She took the diltiazem and  flecainide as instructed and reports she has not had any further palpitations. She has continued to take diltiazem daily and flecainide 50 mg BID as she wasn't sure if she was supposed to stop.  On this maintenance therapy, she has not had any breakthrough spells.  She does however lethargic and tired which is the same problem she had before on the standing dose. ? ?Denies issues with  bleeding. ? ?CV Review of Symptoms (Summary) ?Cardiovascular ROS: no chest pain or dyspnea on exertion ?positive for -   -1 breakthrough spell treated with pill in pocket recommend diltiazem.  But now she remains on standing dose and is again feeling somewhat fatigued. ?negative for - edema, orthopnea, paroxysmal nocturnal dyspnea, rapid heart rate, shortness of breath, or syncope/near syncope or TIA/amaurosis fugax, claudication ? ?REVIEWED OF SYSTEMS  ? ?ROS: Notable only for low-dose fatigue and nausea otherwise negative noted above. ? ?I have reviewed and (if needed) personally updated the patient's problem list, medications, allergies, past medical and surgical history, social and family history.  ? ?PAST MEDICAL HISTORY  ? ?Past Medical History:  ?Diagnosis Date  ? Dyslipidemia   ? On rosuvastatin, Krill oral  ? Hypothyroidism due to Graves' disease status post radiation iodide   ? On levothyroxine  ? Paroxysmal atrial fibrillation (HCC)   ? Rate controlled with diltiazem, rhythm control with flecainide. CHA2DS2Vasc = 2; on Eliquis  ? ? ?PAST SURGICAL HISTORY  ? ?Past Surgical History:  ?Procedure Laterality Date  ? CARDIAC EVENT MONITOR  11/2017  ? 30 monitor: 98% normal sinus rhythm.  2% PAF with rates as high as 160 bpm.  ? NM MYOVIEW LTD  12/2017  ? LOW RISK.  Walk for 9 minutes.  Peak heart rate 179 (116% of MPHR).  No EKG changes to suggest ischemia.  No ischemia or infarction noted on Myoview.  EF 65-70%.  ? None    ? TRANSTHORACIC ECHOCARDIOGRAM  11/28/2017  ? Normal LV size and function.  EF 60-65%.  Wall motion.  Mild AI, Mild-mod TR with mildly elevated PA pressures.  ? ? ? ?There is no immunization history on file for this patient. ? ?MEDICATIONS/ALLERGIES  ? ?Current Meds  ?Medication Sig  ? Bacillus Coagulans-Inulin (PROBIOTIC) 1-250 BILLION-MG CAPS   ? diltiazem (CARDIZEM CD) 120 MG 24 hr capsule TAKE 1 CAPSULE BY MOUTH EVERY DAY WHEN USING FLECAINIDE AS DIRECTED  ? ELIQUIS 5 MG TABS tablet TAKE  1 TABLET BY MOUTH TWICE A DAY  ? flecainide (TAMBOCOR) 50 MG tablet If you develop  afib -  Flecainide Take 200 mg dose  At that time then about 12 hours later start taking 100 mg  twice a day until  rhythm breaks . Once it breaks take 50 mg twice a day for 2 weeks then stop until next episode. AS NEEDED  ? levothyroxine (SYNTHROID) 88 MCG tablet TAKE 1 CAPSULE BY MOUTH EVERY DAY IN THE MORNING ON EMPTY STOMACH  ? rosuvastatin (CRESTOR) 5 MG tablet Take 5 mg by mouth daily.  ?Currently taking flecainide 50 mg twice daily and diltiazem 120 mg daily ? ?Allergies  ?Allergen Reactions  ? Penicillins   ? ? ?SOCIAL HISTORY/FAMILY HISTORY  ? ?Reviewed in Epic:  ?Pertinent findings:  ?Social History  ? ?Tobacco Use  ? Smoking status: Former  ? Smokeless tobacco: Never  ?Substance Use Topics  ? Alcohol use: Yes  ?  Comment: wine  ? Drug use: No  ? ?Social History  ? ?Social History Narrative  ?  She is a married mother of 2.  ? 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  ? She exercises about 3 days a week walking about 10 miles a week total.  She also enjoys yoga and routine hiking.  (Dedicated walking 60 minutes 2 days a week)  ? 1-2 glasses of alcohol/wine per week.  ? Caffeine 2-1/2 cups daily.  ? Non-smoker but history of exposure to passive smoke.  ? ? ?OBJCTIVE -PE, EKG, labs  ? ?Wt Readings from Last 3 Encounters:  ?01/18/22 152 lb (68.9 kg)  ?09/26/21 145 lb 3.2 oz (65.9 kg)  ?06/28/21 146 lb 12.8 oz (66.6 kg)  ? ? ?Physical Exam: ?BP 128/62   Pulse (!) 52   Ht 5\' 4"  (1.626 m)   Wt 152 lb (68.9 kg)   SpO2 98%   BMI 26.09 kg/m?  ?Physical Exam ?Vitals reviewed.  ?Constitutional:   ?   Appearance: Normal appearance.  ?HENT:  ?   Head: Normocephalic and atraumatic.  ?Eyes:  ?   Extraocular Movements: Extraocular movements intact.  ?Cardiovascular:  ?   Rate and Rhythm: Regular rhythm. Bradycardia present.  ?   Heart sounds: Normal heart sounds. No murmur heard. ?Pulmonary:  ?   Effort: Pulmonary effort  is normal.  ?   Breath sounds: Normal breath sounds.  ?Musculoskeletal:  ?   Cervical back: Neck supple.  ?Skin: ?   General: Skin is warm and dry.  ?Neurological:  ?   Mental Status: She is alert.  ? ? ? Adul

## 2022-01-19 LAB — CBC WITH DIFFERENTIAL/PLATELET
Basophils Absolute: 0.1 10*3/uL (ref 0.0–0.2)
Basos: 1 %
EOS (ABSOLUTE): 0.4 10*3/uL (ref 0.0–0.4)
Eos: 5 %
Hematocrit: 41.8 % (ref 34.0–46.6)
Hemoglobin: 13.9 g/dL (ref 11.1–15.9)
Immature Grans (Abs): 0 10*3/uL (ref 0.0–0.1)
Immature Granulocytes: 0 %
Lymphocytes Absolute: 2.3 10*3/uL (ref 0.7–3.1)
Lymphs: 32 %
MCH: 28.8 pg (ref 26.6–33.0)
MCHC: 33.3 g/dL (ref 31.5–35.7)
MCV: 87 fL (ref 79–97)
Monocytes Absolute: 0.6 10*3/uL (ref 0.1–0.9)
Monocytes: 8 %
Neutrophils Absolute: 3.9 10*3/uL (ref 1.4–7.0)
Neutrophils: 54 %
Platelets: 260 10*3/uL (ref 150–450)
RBC: 4.82 x10E6/uL (ref 3.77–5.28)
RDW: 12.9 % (ref 11.7–15.4)
WBC: 7.4 10*3/uL (ref 3.4–10.8)

## 2022-01-19 LAB — COMPREHENSIVE METABOLIC PANEL
ALT: 20 IU/L (ref 0–32)
AST: 25 IU/L (ref 0–40)
Albumin/Globulin Ratio: 1.6 (ref 1.2–2.2)
Albumin: 4.5 g/dL (ref 3.8–4.8)
Alkaline Phosphatase: 82 IU/L (ref 44–121)
BUN/Creatinine Ratio: 15 (ref 12–28)
BUN: 16 mg/dL (ref 8–27)
Bilirubin Total: 0.3 mg/dL (ref 0.0–1.2)
CO2: 25 mmol/L (ref 20–29)
Calcium: 9.8 mg/dL (ref 8.7–10.3)
Chloride: 103 mmol/L (ref 96–106)
Creatinine, Ser: 1.1 mg/dL — ABNORMAL HIGH (ref 0.57–1.00)
Globulin, Total: 2.8 g/dL (ref 1.5–4.5)
Glucose: 82 mg/dL (ref 70–99)
Potassium: 5 mmol/L (ref 3.5–5.2)
Sodium: 142 mmol/L (ref 134–144)
Total Protein: 7.3 g/dL (ref 6.0–8.5)
eGFR: 54 mL/min/{1.73_m2} — ABNORMAL LOW (ref >59)

## 2022-01-20 ENCOUNTER — Encounter: Payer: Self-pay | Admitting: Cardiology

## 2022-01-20 NOTE — Assessment & Plan Note (Addendum)
PAF, CHA2DS2-VASc Score 2  ?Breakthrough A Fib event in December while off of antinodal and antiarrhythmic agents, restarted on diltiazem and flecainide pill in pocket method. Since then doing well, maintaining sinus rhythm but has continued to take diltiazem and flecainide. Having more fatigue with sinus bradycardia and 1st degree AV block on EKG today. No bleeding issues. ?- decrease flecainide to 25mg  BID (1/2 tab) ?- continue diltiazem - while on standing dose flecainide. ?- continue Eliquis for anticoagulation ?- check CBC and metabolic panel ?- consider weaning off flecainide at follow-up if doing well ? ?This was an complicated scenario because she was doing really well on simple pill in pocket technique.  I think this episode she had in December scared her.  For now we will just do maintenance dose for little bit longer and then potentially wean back off. ?

## 2022-01-20 NOTE — Assessment & Plan Note (Signed)
CHA2DS2-VASc score is 2. ? ?Stable on Eliquis.  No bleeding issues. ? ?? Okay to hold Eliquis 48 to 72 hours preop for surgeries or procedures.  (72 hours for more high risk procedures) ?

## 2022-01-20 NOTE — Progress Notes (Signed)
? ? ?  ATTENDING ATTESTATION ? ?I have seen, examined and evaluated the patient along with the Resident Physician (Dr. Littie Deeds) in clinic today.  I personally performed my own interview & exanimation.  After reviewing all the available data and chart, we discussed the patients laboratory, study & physical findings as well as symptoms in detail. I agree with his findings, examination as well as impression recommendations as per our discussion.   ? ?Attending adjustments int the full clinic noted annotated in italics.  ? ?Doing relatively well until a breakthrough spell of Afib in December prior to long trip.  Likely stress related. Broke with pill-in-the pocket Rx of Diltiazem & Flecainide - but she has continue to take maintenance Flecainide & Diltiazem (she took it throughout her trip - just to be sure, and did not stop).  Feels a bit fatigued & lethargic with current dosage. ?Otherwise doing well.   ?ROS & Exam stable/ normal. ? ?A&P:  ?PAF (paroxysmal atrial fibrillation) (HCC):  CHA2DS2-VASc Score 2 (Age 71, female) ?PAF, CHA2DS2-VASc Score 2  ?Breakthrough A Fib event in December while off of antinodal and antiarrhythmic agents, restarted on diltiazem and flecainide pill in pocket method. Since then doing well, maintaining sinus rhythm but has continued to take diltiazem and flecainide. Having more fatigue with sinus bradycardia and 1st degree AV block on EKG today. No bleeding issues. ?- decrease flecainide to 25mg  BID (1/2 tab) ?- continue diltiazem - while on standing dose flecainide. ?- continue Eliquis for anticoagulation ?- check CBC and metabolic panel ?- consider weaning off flecainide at follow-up if doing well ? ?This was an complicated scenario because she was doing really well on simple pill in pocket technique.  I think this episode she had in December scared her.  For now we will just do maintenance dose for little bit longer and then potentially wean back off. ? ?Hypercoagulable state due to  paroxysmal atrial fibrillation (HCC) ?CHA2DS2-VASc score is 2. ? ?Stable on Eliquis.  No bleeding issues. ? ?Okay to hold Eliquis 48 to 72 hours preop for surgeries or procedures.  (72 hours for more high risk procedures) ? ? ? ?ROV 6 months. ? ? ? ? ?January, M.D., M.S. ?Interventional Cardiologist  ? ?Pager # [phone] ?Phone # [phone] ?[address] ?[place], [place] ? ?

## 2022-02-13 ENCOUNTER — Other Ambulatory Visit: Payer: Self-pay | Admitting: Family Medicine

## 2022-02-13 DIAGNOSIS — E559 Vitamin D deficiency, unspecified: Secondary | ICD-10-CM | POA: Diagnosis not present

## 2022-02-13 DIAGNOSIS — M8588 Other specified disorders of bone density and structure, other site: Secondary | ICD-10-CM

## 2022-02-13 DIAGNOSIS — M8580 Other specified disorders of bone density and structure, unspecified site: Secondary | ICD-10-CM | POA: Diagnosis not present

## 2022-02-13 DIAGNOSIS — I480 Paroxysmal atrial fibrillation: Secondary | ICD-10-CM | POA: Diagnosis not present

## 2022-02-13 DIAGNOSIS — E039 Hypothyroidism, unspecified: Secondary | ICD-10-CM | POA: Diagnosis not present

## 2022-02-13 DIAGNOSIS — Z1331 Encounter for screening for depression: Secondary | ICD-10-CM | POA: Diagnosis not present

## 2022-02-13 DIAGNOSIS — N183 Chronic kidney disease, stage 3 unspecified: Secondary | ICD-10-CM | POA: Diagnosis not present

## 2022-02-13 DIAGNOSIS — E7800 Pure hypercholesterolemia, unspecified: Secondary | ICD-10-CM | POA: Diagnosis not present

## 2022-02-13 DIAGNOSIS — D6869 Other thrombophilia: Secondary | ICD-10-CM | POA: Diagnosis not present

## 2022-02-13 DIAGNOSIS — Z0000 Encounter for general adult medical examination without abnormal findings: Secondary | ICD-10-CM | POA: Diagnosis not present

## 2022-02-18 ENCOUNTER — Other Ambulatory Visit: Payer: Medicare PPO

## 2022-03-29 DIAGNOSIS — M25561 Pain in right knee: Secondary | ICD-10-CM | POA: Diagnosis not present

## 2022-03-29 DIAGNOSIS — M1711 Unilateral primary osteoarthritis, right knee: Secondary | ICD-10-CM | POA: Diagnosis not present

## 2022-05-09 DIAGNOSIS — J019 Acute sinusitis, unspecified: Secondary | ICD-10-CM | POA: Diagnosis not present

## 2022-05-09 DIAGNOSIS — R070 Pain in throat: Secondary | ICD-10-CM | POA: Diagnosis not present

## 2022-05-16 DIAGNOSIS — J019 Acute sinusitis, unspecified: Secondary | ICD-10-CM | POA: Diagnosis not present

## 2022-05-28 DIAGNOSIS — H47323 Drusen of optic disc, bilateral: Secondary | ICD-10-CM | POA: Diagnosis not present

## 2022-05-28 DIAGNOSIS — H524 Presbyopia: Secondary | ICD-10-CM | POA: Diagnosis not present

## 2022-07-01 ENCOUNTER — Ambulatory Visit: Payer: Medicare PPO | Admitting: Cardiology

## 2022-07-06 ENCOUNTER — Other Ambulatory Visit: Payer: Self-pay | Admitting: Cardiology

## 2022-07-08 NOTE — Telephone Encounter (Signed)
Prescription refill request for Eliquis received. Indication:Afib Last office visit:3/23 Scr:1.1 Age: 71 Weight:68.9 kg  Prescription refilled

## 2022-07-13 ENCOUNTER — Other Ambulatory Visit: Payer: Self-pay | Admitting: Cardiology

## 2022-07-16 NOTE — Progress Notes (Unsigned)
Cardiology Office Note:    Date:  07/17/2022   ID:  Nicole Haynes, DOB 1951-09-30, MRN [id number]  PCP:  Kelton Pillar, [place] Cardiologist: Glenetta Hew, MD   Reason for visit: 98-month follow-up  History of Present Illness:    Nicole Haynes is a 71 y.o. female with a hx of atrial fibrillation.  In 2019, Patient had a low risk stress, echo with normal EF, normal LA size, mild AI, mild-mod TR.  Monitor showed 2% a-fib burden with ventricular rate to 160 bpm.  Patient was last seen by Dr. Ellyn Hack in March 2023.  Patient was on diltiazem and flecainide.  With fatigue, sinus bradycardia and first-degree AV block, flecainide dose decreased to 25 mg twice daily.  She was continued on diltiazem.  Dr. Ellyn Hack recommended considering weaning off flecainide and follow-up if doing well.  Today, patient states no recent palpitations.  She does feel like her fatigue is slightly better with decrease flecainide dose from 50 mg twice daily 25 mg twice daily.  She notes her heart rate at rest is in the 50s.  She is still able to hike and golf and stay active.  She brings up that a friend of hers had an A-fib ablation and wonders if this is a option for her.  She wonders if she can get off medications if she had A-fib ablation.  Otherwise, she denies chest pain, shortness of breath, lightheadedness, syncope, PND, orthopnea and lower extremity edema.  She denies history of sleep apnea.  Her thyroid is well controlled on Synthroid.     Past Medical History:  Diagnosis Date   Dyslipidemia    On rosuvastatin, Krill oral   Hypothyroidism due to Graves' disease status post radiation iodide    On levothyroxine   Paroxysmal atrial fibrillation (HCC)    Rate controlled with diltiazem, rhythm control with flecainide. CHA2DS2Vasc = 2; on Eliquis    Past Surgical History:  Procedure Laterality Date   CARDIAC EVENT MONITOR  11/2017   30 monitor: 98% normal sinus rhythm.  2% PAF  with rates as high as 160 bpm.   NM MYOVIEW LTD  12/2017   LOW RISK.  Walk for 9 minutes.  Peak heart rate 179 (116% of MPHR).  No EKG changes to suggest ischemia.  No ischemia or infarction noted on Myoview.  EF 65-70%.   None     TRANSTHORACIC ECHOCARDIOGRAM  11/28/2017   Normal LV size and function.  EF 60-65%.  Wall motion.  Mild AI, Mild-mod TR with mildly elevated PA pressures.    Current Medications: Current Meds  Medication Sig   Bacillus Coagulans-Inulin (PROBIOTIC) 1-250 BILLION-MG CAPS    diltiazem (CARDIZEM CD) 120 MG 24 hr capsule TAKE 1 CAPSULE BY MOUTH EVERY DAY WHEN USING FLECAINIDE AS DIRECTED   ELIQUIS 5 MG TABS tablet TAKE 1 TABLET BY MOUTH TWICE A DAY   flecainide (TAMBOCOR) 50 MG tablet Take 1/2 tablet ( 25 mg ) twice a day   levothyroxine (SYNTHROID) 88 MCG tablet TAKE 1 CAPSULE BY MOUTH EVERY DAY IN THE MORNING ON EMPTY STOMACH   rosuvastatin (CRESTOR) 5 MG tablet Take 5 mg by mouth daily.     Allergies:   Penicillins   Social History   Socioeconomic History   Marital status: Married    Spouse name: Not on file   Number of children: 2   Years of education: Not on file   Highest education level: Not on file  Occupational History  Occupation: Runner, broadcasting/film/video    Comment: Retired  Tobacco Use   Smoking status: Former   Smokeless tobacco: Never  Substance and Sexual Activity   Alcohol use: Yes    Comment: wine   Drug use: No   Sexual activity: Not on file  Other Topics Concern   Not on file  Social History Narrative   She is a married mother of 2.   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   She exercises about 3 days a week walking about 10 miles a week total.  She also enjoys yoga and routine hiking.  (Dedicated walking 60 minutes 2 days a week)   1-2 glasses of alcohol/wine per week.   Caffeine 2-1/2 cups daily.   Non-smoker but history of exposure to passive smoke.   Social Determinants of Health   Financial Resource Strain: Not on  file  Food Insecurity: Not on file  Transportation Needs: Not on file  Physical Activity: Not on file  Stress: Not on file  Social Connections: Not on file     Family History: The patient's family history is negative for Breast cancer.  ROS:   Please see the history of present illness.     EKGs/Labs/Other Studies Reviewed:    Recent Labs: 01/18/2022: ALT 20; BUN 16; Creatinine, Ser 1.10; Hemoglobin 13.9; Platelets 260; Potassium 5.0; Sodium 142   Recent Lipid Panel No results found for: "CHOL", "TRIG", "HDL", "LDLCALC", "LDLDIRECT"  Physical Exam:    VS:  BP 112/60   Pulse (!) 59   Ht 5\' 4"  (1.626 m)   Wt 148 lb 6.4 oz (67.3 kg)   SpO2 97%   BMI 25.47 kg/m    No data found.       Wt Readings from Last 3 Encounters:  07/17/22 148 lb 6.4 oz (67.3 kg)  01/18/22 152 lb (68.9 kg)  09/26/21 145 lb 3.2 oz (65.9 kg)     GEN:  Well nourished, well developed in no acute distress HEENT: Normal NECK: No JVD; No carotid bruits CARDIAC: RRR, no murmurs, rubs, gallops RESPIRATORY:  Clear to auscultation without rales, wheezing or rhonchi  ABDOMEN: Soft, non-tender, non-distended MUSCULOSKELETAL: No edema; No deformity  SKIN: Warm and dry NEUROLOGIC:  Alert and oriented PSYCHIATRIC:  Normal affect     ASSESSMENT AND PLAN   Paroxysmal atrial fibrillation, no recent palpitations -Breakthrough A Fib event in December 2022 while off of antinodal and antiarrhythmic agents, restarted on diltiazem and flecainide.  With fatigue, flecainide decreased to 25 mg twice daily. -Check EKG today --EKG shows sinus bradycardia with heart rate 52.  First-degree AV block no longer present. -For now continue flecainide and diltiazem. -Refer to EP for consideration of A-fib ablation.  Recheck 2D echo. -Continue Eliquis for stroke prevention.  Hyperlipidemia with goal LDL less than 100 -LDL 91 in April 2023.  Continue Crestor. -Discussed cholesterol lowering diets - Mediterranean diet, DASH  diet, vegetarian diet, low-carbohydrate diet and avoidance of trans fats.  Discussed healthier choice substitutes.  Nuts, high-fiber foods, and fiber supplements may also improve lipids.    Disposition - Follow-up in 6 months with Dr. May 2023.  In the meanwhile, check 2D echo and get EP evaluation for A-fib ablation.   Medication Adjustments/Labs and Tests Ordered: Current medicines are reviewed at length with the patient today.  Concerns regarding medicines are outlined above.  Orders Placed This Encounter  Procedures   Ambulatory referral to Cardiac Electrophysiology   EKG 12-Lead   ECHOCARDIOGRAM COMPLETE  No orders of the defined types were placed in this encounter.   Patient Instructions  Medication Instructions:  The current medical regimen is effective;  continue present plan and medications.  *If you need a refill on your cardiac medications before your next appointment, please call your pharmacy*   Testing/Procedures: Echocardiogram - Your physician has requested that you have an echocardiogram. Echocardiography is a painless test that uses sound waves to create images of your heart. It provides your doctor with information about the size and shape of your heart and how well your heart's chambers and valves are working. This procedure takes approximately one hour. There are no restrictions for this procedure.     Follow-Up: At [facility], you and your health needs are our priority.  As part of our continuing mission to provide you with exceptional heart care, we have created designated Provider Care Teams.  These Care Teams include your primary Cardiologist (physician) and Advanced Practice Providers (APPs -  Physician Assistants and Nurse Practitioners) who all work together to provide you with the care you need, when you need it.  We recommend signing up for the patient portal called "MyChart".  Sign up information is provided on this After Visit Summary.   MyChart is used to connect with patients for Virtual Visits (Telemedicine).  Patients are able to view lab/test results, encounter notes, upcoming appointments, etc.  Non-urgent messages can be sent to your provider as well.   To learn more about what you can do with MyChart, go to [email or web address].    Your next appointment:   6 month(s)  The format for your next appointment:   In Person  Provider:   [name], MD   Referral sent to EP- They will contact you for an appointment.         Signed, Cannon Kettle, PA-C  07/17/2022 10:07 AM    [facility]

## 2022-07-17 ENCOUNTER — Ambulatory Visit: Payer: Medicare PPO | Attending: Cardiology | Admitting: Physician Assistant

## 2022-07-17 ENCOUNTER — Encounter: Payer: Self-pay | Admitting: Physician Assistant

## 2022-07-17 VITALS — BP 112/60 | HR 59 | Ht 64.0 in | Wt 148.4 lb

## 2022-07-17 DIAGNOSIS — I480 Paroxysmal atrial fibrillation: Secondary | ICD-10-CM

## 2022-07-17 DIAGNOSIS — Z7901 Long term (current) use of anticoagulants: Secondary | ICD-10-CM | POA: Diagnosis not present

## 2022-07-17 DIAGNOSIS — E785 Hyperlipidemia, unspecified: Secondary | ICD-10-CM | POA: Diagnosis not present

## 2022-07-17 NOTE — Patient Instructions (Addendum)
Medication Instructions:  The current medical regimen is effective;  continue present plan and medications.  *If you need a refill on your cardiac medications before your next appointment, please call your pharmacy*   Testing/Procedures: Echocardiogram - Your physician has requested that you have an echocardiogram. Echocardiography is a painless test that uses sound waves to create images of your heart. It provides your doctor with information about the size and shape of your heart and how well your heart's chambers and valves are working. This procedure takes approximately one hour. There are no restrictions for this procedure.     Follow-Up: At [facility], you and your health needs are our priority.  As part of our continuing mission to provide you with exceptional heart care, we have created designated Provider Care Teams.  These Care Teams include your primary Cardiologist (physician) and Advanced Practice Providers (APPs -  Physician Assistants and Nurse Practitioners) who all work together to provide you with the care you need, when you need it.  We recommend signing up for the patient portal called "MyChart".  Sign up information is provided on this After Visit Summary.  MyChart is used to connect with patients for Virtual Visits (Telemedicine).  Patients are able to view lab/test results, encounter notes, upcoming appointments, etc.  Non-urgent messages can be sent to your provider as well.   To learn more about what you can do with MyChart, go to [email or web address].    Your next appointment:   6 month(s)  The format for your next appointment:   In Person  Provider:   Glenetta Hew, MD   Referral sent to EP- They will contact you for an appointment.

## 2022-07-30 ENCOUNTER — Other Ambulatory Visit: Payer: Self-pay | Admitting: Family Medicine

## 2022-07-30 DIAGNOSIS — M8580 Other specified disorders of bone density and structure, unspecified site: Secondary | ICD-10-CM

## 2022-08-02 ENCOUNTER — Ambulatory Visit
Admission: RE | Admit: 2022-08-02 | Discharge: 2022-08-02 | Disposition: A | Discharge status: Home / Self Care | Payer: Medicare PPO | Source: Ambulatory Visit | Attending: Family Medicine | Admitting: Family Medicine

## 2022-08-02 DIAGNOSIS — M8580 Other specified disorders of bone density and structure, unspecified site: Secondary | ICD-10-CM

## 2022-08-02 DIAGNOSIS — M8589 Other specified disorders of bone density and structure, multiple sites: Secondary | ICD-10-CM | POA: Diagnosis not present

## 2022-08-02 DIAGNOSIS — Z780 Asymptomatic menopausal state: Secondary | ICD-10-CM | POA: Diagnosis not present

## 2022-08-12 ENCOUNTER — Ambulatory Visit (HOSPITAL_COMMUNITY): Payer: Medicare PPO | Attending: Cardiology

## 2022-08-12 DIAGNOSIS — I480 Paroxysmal atrial fibrillation: Secondary | ICD-10-CM | POA: Diagnosis not present

## 2022-08-12 LAB — ECHOCARDIOGRAM COMPLETE
Area-P 1/2: 2.42 cm2
P 1/2 time: 482 msec
S' Lateral: 2.8 cm

## 2022-08-31 ENCOUNTER — Other Ambulatory Visit: Payer: Self-pay | Admitting: Cardiology

## 2022-09-16 ENCOUNTER — Encounter: Payer: Self-pay | Admitting: Cardiology

## 2022-09-16 ENCOUNTER — Ambulatory Visit: Payer: Medicare PPO | Attending: Cardiology | Admitting: Cardiology

## 2022-09-16 VITALS — BP 112/66 | HR 62 | Ht 64.0 in | Wt 149.0 lb

## 2022-09-16 DIAGNOSIS — I480 Paroxysmal atrial fibrillation: Secondary | ICD-10-CM | POA: Diagnosis not present

## 2022-09-16 NOTE — Progress Notes (Addendum)
Family Electrophysiology Office Note:    Date:  09/16/2022   ID:  Nicole, Haynes Feb 27, 1951, MRN [id number]  PCP:  Maurice Small, MD  [facility] Cardiologist:  [name], MD  [facility] Electrophysiologist:  [name], MD   Referring MD: Marykay Lex, MD   Chief Complaint: Atrial fibrillation  History of Present Illness:    Nicole Haynes is a 71 y.o. female who presents for an evaluation of atrial fibrillation at the request of Dr. Herbie Baltimore. Their medical history includes atrial fibrillation, hypothyroidism, hyperlipidemia.  The patient was previously treated with flecainide for her atrial fibrillation but this was stopped due to conduction system disease.  She actually restarted it given her recurrence of symptoms associate with her atrial fibrillation but desires a long-term strategy of atrial fibrillation management that would avoid prolonged use of antiarrhythmic drug such as flecainide.  She takes Eliquis for stroke prophylaxis without bleeding concerns.     Past Medical History:  Diagnosis Date   Dyslipidemia    On rosuvastatin, Krill oral   Hypothyroidism due to Graves' disease status post radiation iodide    On levothyroxine   Paroxysmal atrial fibrillation (HCC)    Rate controlled with diltiazem, rhythm control with flecainide. CHA2DS2Vasc = 2; on Eliquis    Past Surgical History:  Procedure Laterality Date   CARDIAC EVENT MONITOR  11/2017   30 monitor: 98% normal sinus rhythm.  2% PAF with rates as high as 160 bpm.   NM MYOVIEW LTD  12/2017   LOW RISK.  Walk for 9 minutes.  Peak heart rate 179 (116% of MPHR).  No EKG changes to suggest ischemia.  No ischemia or infarction noted on Myoview.  EF 65-70%.   None     TRANSTHORACIC ECHOCARDIOGRAM  11/28/2017   Normal LV size and function.  EF 60-65%.  Wall motion.  Mild AI, Mild-mod TR with mildly elevated PA pressures.    Current Medications: Current Meds  Medication Sig    Bacillus Coagulans-Inulin (PROBIOTIC) 1-250 BILLION-MG CAPS    diltiazem (CARDIZEM CD) 120 MG 24 hr capsule TAKE 1 CAPSULE BY MOUTH EVERY DAY WHEN USING FLECAINIDE AS DIRECTED   ELIQUIS 5 MG TABS tablet TAKE 1 TABLET BY MOUTH TWICE A DAY   flecainide (TAMBOCOR) 50 MG tablet IF YOU DEVELOP AFIB - FLECAINIDE TAKE 200 MG DOSE AT THAT TIME THEN ABOUT 12 HOURS LATER START TAKING 100 MG TWICE A DAY UNTIL RHYTHM BREAKS . ONCE IT BREAKS TAKE 50 MG TWICE A DAY FOR 2 WEEKS THEN STOP UNTIL NEXT EPISODE. AS NEEDED   levothyroxine (SYNTHROID) 88 MCG tablet TAKE 1 CAPSULE BY MOUTH EVERY DAY IN THE MORNING ON EMPTY STOMACH   rosuvastatin (CRESTOR) 5 MG tablet Take 5 mg by mouth daily.     Allergies:   Penicillins   Social History   Socioeconomic History   Marital status: Married    Spouse name: Not on file   Number of children: 2   Years of education: Not on file   Highest education level: Not on file  Occupational History   Occupation: Runner, broadcasting/film/video    Comment: Retired  Tobacco Use   Smoking status: Former   Smokeless tobacco: Never  Substance and Sexual Activity   Alcohol use: Yes    Comment: wine   Drug use: No   Sexual activity: Not on file  Other Topics Concern   Not on file  Social History Narrative   She is a married mother of  2.   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   She exercises about 3 days a week walking about 10 miles a week total.  She also enjoys yoga and routine hiking.  (Dedicated walking 60 minutes 2 days a week)   1-2 glasses of alcohol/wine per week.   Caffeine 2-1/2 cups daily.   Non-smoker but history of exposure to passive smoke.   Social Determinants of Health   Financial Resource Strain: Not on file  Food Insecurity: Not on file  Transportation Needs: Not on file  Physical Activity: Not on file  Stress: Not on file  Social Connections: Not on file     Family History: The patient's family history is negative for Breast cancer.  ROS:    Please see the history of present illness.    All other systems reviewed and are negative.  EKGs/Labs/Other Studies Reviewed:    The following studies were reviewed today:   EKG:  The ekg from September 27 shows sinus rhythm with a normal PR interval.   Recent Labs: 01/18/2022: ALT 20; BUN 16; Creatinine, Ser 1.10; Hemoglobin 13.9; Platelets 260; Potassium 5.0; Sodium 142  Recent Lipid Panel No results found for: "CHOL", "TRIG", "HDL", "CHOLHDL", "VLDL", "LDLCALC", "LDLDIRECT"  Physical Exam:    VS:  BP 112/66   Pulse 62   Ht 5\' 4"  (1.626 m)   Wt 149 lb (67.6 kg)   SpO2 96%   BMI 25.58 kg/m     Wt Readings from Last 3 Encounters:  09/16/22 149 lb (67.6 kg)  07/17/22 148 lb 6.4 oz (67.3 kg)  01/18/22 152 lb (68.9 kg)     GEN:  Well nourished, well developed in no acute distress HEENT: Normal NECK: No JVD; No carotid bruits LYMPHATICS: No lymphadenopathy CARDIAC: RRR, no murmurs, rubs, gallops RESPIRATORY:  Clear to auscultation without rales, wheezing or rhonchi  ABDOMEN: Soft, non-tender, non-distended MUSCULOSKELETAL:  No edema; No deformity  SKIN: Warm and dry NEUROLOGIC:  Alert and oriented x 3 PSYCHIATRIC:  Normal affect       ASSESSMENT:    1. PAF (paroxysmal atrial fibrillation) (HCC)    PLAN:    In order of problems listed above:  #Paroxysmal atrial fibrillation Symptomatic.  Is on low-dose flecainide with reasonable control of her symptoms.  I does flecainide this not an option given the development of conduction system disease in the past.  Discussed treatment options today for their AF including antiarrhythmic drug therapy and ablation. Discussed risks, recovery and likelihood of success. Discussed potential need for repeat ablation procedures and antiarrhythmic drugs after an initial ablation.   Risk, benefits, and alternatives to EP study and radiofrequency ablation for afib were also discussed in detail today. These risks include but are not  limited to stroke, bleeding, vascular damage, tamponade, perforation, damage to the esophagus, lungs, and other structures, pulmonary vein stenosis, worsening renal function, and death. The patient understands these risks.  Carto, ICE, anesthesia are requested for the procedure.  Will also obtain CT PV protocol prior to the procedure to exclude LAA thrombus and further evaluate atrial anatomy.   Medication Adjustments/Labs and Tests Ordered: Current medicines are reviewed at length with the patient today.  Concerns regarding medicines are outlined above.  No orders of the defined types were placed in this encounter.  No orders of the defined types were placed in this encounter.    Signed, 01/20/22. Rossie Muskrat, MD, [facility], [facility] 09/16/2022 9:35 PM    Electrophysiology [facility] 

## 2022-09-16 NOTE — Patient Instructions (Signed)
Medication Instructions:  Your physician recommends that you continue on your current medications as directed. Please refer to the Current Medication list given to you today.  *If you need a refill on your cardiac medications before your next appointment, please call your pharmacy*   Lab Work: None ordered.  If you have labs (blood work) drawn today and your tests are completely normal, you will receive your results only by: MyChart Message (if you have MyChart) OR A paper copy in the mail If you have any lab test that is abnormal or we need to change your treatment, we will call you to review the results.   Testing/Procedures:  Cardiac Ablation Cardiac ablation is a procedure to destroy, or ablate, a small amount of heart tissue that is causing problems. The heart has many electrical connections. Sometimes, these connections are abnormal and can cause the heart to beat very fast or irregularly. Ablating the abnormal areas can improve the heart's rhythm or return it to normal. Ablation may be done for people who: Have irregular or rapid heartbeats (arrhythmias). Have Wolff-Parkinson-White syndrome. Have taken medicines for an arrhythmia that did not work or caused side effects. Have a high-risk heartbeat that may be life-threatening. Tell a health care provider about: Any allergies you have. All medicines you are taking, including vitamins, herbs, eye drops, creams, and over-the-counter medicines. Any problems you or family members have had with anesthesia. Any bleeding problems you have. Any surgeries you have had. Any medical conditions you have. Whether you are pregnant or may be pregnant. What are the risks? Your health care provider will talk with you about risks. These may include: Infection. Bruising and bleeding. Stroke or blood clots. Damage to nearby structures or organs. Allergic reaction to medicines or dyes. Needing a pacemaker if the heart gets damaged. A pacemaker  is a device that helps the heart beat normally. Failure of the procedure. A repeat procedure may be needed. What happens before the procedure? Medicines Ask your health care provider about: Changing or stopping your regular medicines. These include any heart rhythm medicines, diabetes medicines, or blood thinners you take. Taking medicines such as aspirin and ibuprofen. These medicines can thin your blood. Do not take them unless your health care provider tells you to. Taking over-the-counter medicines, vitamins, herbs, and supplements. General instructions Follow instructions from your health care provider about what you may eat and drink. If you will be going home right after the procedure, plan to have a responsible adult: Take you home from the hospital or clinic. You will not be allowed to drive. Care for you for the time you are told. Ask your health care provider what steps will be taken to prevent infection. What happens during the procedure?  An IV will be inserted into one of your veins. You may be given: A sedative. This helps you relax. Anesthesia. This will: Numb certain areas of your body. An incision will be made in your neck or your groin. A needle will be inserted through the incision and into a large vein in your neck or groin. The small, thin tube (catheter) will be inserted through the needle and moved to your heart. A type of X-ray (fluoroscopy) will be used to help guide the catheter and provide images of the heart on a monitor. Dye may be injected through the catheter to help your surgeon see the area of the heart that needs treatment. Electrical currents will be sent from the catheter to destroy heart tissue in certain  areas. There are three types of energy that may be used to do this: Heat (radiofrequency energy). Laser energy. Extreme cold (cryoablation). When the tissue has been destroyed, the catheter will be removed. Pressure will be held on the insertion  area to prevent bleeding. A bandage (dressing) will be placed over the insertion area. The procedure may vary among health care providers and hospitals. What happens after the procedure? Your blood pressure, heart rate and rhythm, breathing rate, and blood oxygen level will be monitored until you leave the hospital or clinic. Your insertion area will be checked for bleeding. You will need to lie still for a few hours. If your groin was used, you will need to keep your leg straight for a few hours after the catheter is removed. This information is not intended to replace advice given to you by your health care provider. Make sure you discuss any questions you have with your health care provider. Document Revised: 03/26/2022 Document Reviewed: 03/26/2022 Elsevier Patient Education  Strong: At [facility], you and your health needs are our priority.  As part of our continuing mission to provide you with exceptional heart care, we have created designated Provider Care Teams.  These Care Teams include your primary Cardiologist (physician) and Advanced Practice Providers (APPs -  Physician Assistants and Nurse Practitioners) who all work together to provide you with the care you need, when you need it.  We recommend signing up for the patient portal called "MyChart".  Sign up information is provided on this After Visit Summary.  MyChart is used to connect with patients for Virtual Visits (Telemedicine).  Patients are able to view lab/test results, encounter notes, upcoming appointments, etc.  Non-urgent messages can be sent to your provider as well.   To learn more about what you can do with MyChart, go to [email or web address].    Your next appointment:   To be scheduled   Important Information About Sugar

## 2022-09-20 ENCOUNTER — Ambulatory Visit
Admission: EM | Admit: 2022-09-20 | Discharge: 2022-09-20 | Disposition: A | Discharge status: Home / Self Care | Payer: Medicare PPO | Attending: Family Medicine | Admitting: Family Medicine

## 2022-09-20 ENCOUNTER — Encounter: Payer: Self-pay | Admitting: Emergency Medicine

## 2022-09-20 DIAGNOSIS — R059 Cough, unspecified: Secondary | ICD-10-CM | POA: Insufficient documentation

## 2022-09-20 DIAGNOSIS — J0100 Acute maxillary sinusitis, unspecified: Secondary | ICD-10-CM | POA: Insufficient documentation

## 2022-09-20 DIAGNOSIS — Z1152 Encounter for screening for COVID-19: Secondary | ICD-10-CM | POA: Insufficient documentation

## 2022-09-20 DIAGNOSIS — J309 Allergic rhinitis, unspecified: Secondary | ICD-10-CM | POA: Insufficient documentation

## 2022-09-20 DIAGNOSIS — R599 Enlarged lymph nodes, unspecified: Secondary | ICD-10-CM | POA: Insufficient documentation

## 2022-09-20 LAB — RESP PANEL BY RT-PCR (FLU A&B, COVID) ARPGX2
Influenza A by PCR: NEGATIVE
Influenza B by PCR: NEGATIVE
SARS Coronavirus 2 by RT PCR: NEGATIVE

## 2022-09-20 MED ORDER — DOXYCYCLINE HYCLATE 100 MG PO CAPS: 100.0000 mg | ORAL_CAPSULE | Freq: Two times a day (BID) | ORAL | 0 refills | Status: AC

## 2022-09-20 MED ORDER — BENZONATATE 200 MG PO CAPS: 200.0000 mg | ORAL_CAPSULE | Freq: Three times a day (TID) | ORAL | 0 refills | Status: AC | PRN

## 2022-09-20 MED ORDER — FEXOFENADINE HCL 180 MG PO TABS: 180.0000 mg | ORAL_TABLET | Freq: Every day | ORAL | 0 refills | Status: DC

## 2022-09-20 NOTE — Discharge Instructions (Addendum)
Advised patient to take medication as directed with food to completion.  Advised patient to take Allegra with Doxycycline daily for the next 5 of 7 days.  Advised may use Allegra as needed afterwards for concurrent postnasal drainage/drip.  Advised may use Tessalon Perles daily or as needed for cough.  Encouraged patient to increase daily water intake to 64 ounces per day while taking these medications.  Advised we will follow-up with COVID-19/influenza results once received.  If symptoms worsen and/or unresolved please follow-up with PCP or here for further evaluation.

## 2022-09-20 NOTE — ED Provider Notes (Signed)
[facility]    CSN: [id number] Arrival date & time: 09/20/22  0803      History   Chief Complaint Chief Complaint  Patient presents with   Cough    HPI Nicole Haynes is a 71 y.o. female.   HPI 71 year old female presents with cough, nasal congestion, postnasal drip and swollen lymph nodes for 7 days.  Reports negative home COVID-19 test.  PMH significant for paroxysmal atrial fibrillation, dyslipidemia, and hypothyroidism.  Past Medical History:  Diagnosis Date   Dyslipidemia    On rosuvastatin, Krill oral   Hypothyroidism due to Graves' disease status post radiation iodide    On levothyroxine   Paroxysmal atrial fibrillation (HCC)    Rate controlled with diltiazem, rhythm control with flecainide. CHA2DS2Vasc = 2; on Eliquis    Patient Active Problem List   Diagnosis Date Noted   Hypercoagulable state due to paroxysmal atrial fibrillation (HCC) 07/07/2021   Long QT interval 02/05/2019   DOE (dyspnea on exertion) 12/25/2017   PAF (paroxysmal atrial fibrillation) (HCC):  CHA2DS2-VASc Score 2 (Age 55, female) 11/21/2017    Past Surgical History:  Procedure Laterality Date   CARDIAC EVENT MONITOR  11/2017   30 monitor: 98% normal sinus rhythm.  2% PAF with rates as high as 160 bpm.   NM MYOVIEW LTD  12/2017   LOW RISK.  Walk for 9 minutes.  Peak heart rate 179 (116% of MPHR).  No EKG changes to suggest ischemia.  No ischemia or infarction noted on Myoview.  EF 65-70%.   None     TRANSTHORACIC ECHOCARDIOGRAM  11/28/2017   Normal LV size and function.  EF 60-65%.  Wall motion.  Mild AI, Mild-mod TR with mildly elevated PA pressures.    OB History   No obstetric history on file.      Home Medications    Prior to Admission medications   Medication Sig Start Date End Date Taking? Authorizing Provider  Bacillus Coagulans-Inulin (PROBIOTIC) 1-250 BILLION-MG CAPS    Yes [provider]  benzonatate (TESSALON) 200 MG capsule Take 1 capsule  (200 mg total) by mouth 3 (three) times daily as needed for up to 7 days. 09/20/22 09/27/22 Yes Trevor Iha, FNP  Calcium Carbonate-Vit D-Min (CALCIUM 1200 PO) Take 1 capsule by mouth daily.   Yes [provider]  diltiazem (CARDIZEM CD) 120 MG 24 hr capsule TAKE 1 CAPSULE BY MOUTH EVERY DAY WHEN USING FLECAINIDE AS DIRECTED 07/15/22  Yes Marykay Lex, MD  doxycycline (VIBRAMYCIN) 100 MG capsule Take 1 capsule (100 mg total) by mouth 2 (two) times daily for 7 days. 09/20/22 09/27/22 Yes Trevor Iha, FNP  ELIQUIS 5 MG TABS tablet TAKE 1 TABLET BY MOUTH TWICE A DAY 07/08/22  Yes Marykay Lex, MD  fexofenadine [facility] ALLERGY) 180 MG tablet Take 1 tablet (180 mg total) by mouth daily for 15 days. 09/20/22 10/05/22 Yes Trevor Iha, FNP  flecainide (TAMBOCOR) 50 MG tablet IF YOU DEVELOP AFIB - FLECAINIDE TAKE 200 MG DOSE AT THAT TIME THEN ABOUT 12 HOURS LATER START TAKING 100 MG TWICE A DAY UNTIL RHYTHM BREAKS . ONCE IT BREAKS TAKE 50 MG TWICE A DAY FOR 2 WEEKS THEN STOP UNTIL NEXT EPISODE. AS NEEDED 09/02/22  Yes Marykay Lex, MD  levothyroxine (SYNTHROID) 88 MCG tablet TAKE 1 CAPSULE BY MOUTH EVERY DAY IN THE MORNING ON EMPTY STOMACH 01/14/19  Yes [provider]  rosuvastatin (CRESTOR) 5 MG tablet Take 5 mg by mouth daily. 10/11/17  Yes [provider]    Family History Family History  Problem Relation Age of Onset   Breast cancer Neg Hx     Social History Social History   Tobacco Use   Smoking status: Former   Smokeless tobacco: Never  Building services engineer Use: Never used  Substance Use Topics   Alcohol use: Yes    Comment: wine   Drug use: No     Allergies   Penicillins   Review of Systems Review of Systems  HENT:  Positive for congestion and postnasal drip.        Swollen lymph nodes x 7 days  Respiratory:  Positive for cough.   All other systems reviewed and are negative.    Physical Exam Triage Vital Signs ED Triage Vitals   Enc Vitals Group     BP 09/20/22 0829 134/76     Pulse Rate 09/20/22 0829 60     Resp 09/20/22 0829 18     Temp 09/20/22 0829 99.1 F (37.3 C)     Temp Source 09/20/22 0829 Oral     SpO2 09/20/22 0829 94 %     Weight 09/20/22 0831 148 lb (67.1 kg)     Height 09/20/22 0831 5\' 4"  (1.626 m)     Head Circumference --      Peak Flow --      Pain Score 09/20/22 0831 0     Pain Loc --      Pain Edu? --      Excl. in GC? --    No data found.  Updated Vital Signs BP 134/76 (BP Location: Right Arm)   Pulse 60   Temp 99.1 F (37.3 C) (Oral)   Resp 18   Ht 5\' 4"  (1.626 m)   Wt 148 lb (67.1 kg)   SpO2 96%   BMI 25.40 kg/m      Physical Exam Vitals and nursing note reviewed.  Constitutional:      General: She is not in acute distress.    Appearance: Normal appearance. She is normal weight. She is ill-appearing.  HENT:     Head: Normocephalic and atraumatic.     Right Ear: External ear normal.     Left Ear: External ear normal.     Ears:     Comments: Moderate eustachian tube dysfunction noted bilaterally, EAC's compressed, TMs are dull, retracted bilaterally    Nose: Nose normal.     Comments: Erythematous/edematous    Mouth/Throat:     Mouth: Mucous membranes are moist.     Pharynx: Oropharynx is clear. No oropharyngeal exudate.     Comments: Significant amount of clear drainage of posterior oropharynx noted Eyes:     Extraocular Movements: Extraocular movements intact.     Conjunctiva/sclera: Conjunctivae normal.     Pupils: Pupils are equal, round, and reactive to light.  Cardiovascular:     Rate and Rhythm: Normal rate and regular rhythm.     Pulses: Normal pulses.     Heart sounds: Normal heart sounds.  Pulmonary:     Effort: Pulmonary effort is normal.     Breath sounds: Normal breath sounds. No wheezing, rhonchi or rales.  Musculoskeletal:        General: Normal range of motion.     Cervical back: Normal range of motion and neck supple. Tenderness present.   Lymphadenopathy:     Cervical: Cervical adenopathy present.  Skin:    General: Skin is warm and dry.  Neurological:  General: No focal deficit present.     Mental Status: She is alert and oriented to person, place, and time.      UC Treatments / Results  Labs (all labs ordered are listed, but only abnormal results are displayed) Labs Reviewed  RESP PANEL BY RT-PCR (FLU A&B, COVID) [id number]    EKG   Radiology No results found.  Procedures Procedures (including critical care time)  Medications Ordered in UC Medications - No data to display  Initial Impression / Assessment and Plan / UC Course  I have reviewed the triage vital signs and the nursing notes.  Pertinent labs & imaging results that were available during my care of the patient were reviewed by me and considered in my medical decision making (see chart for details).     MDM: 1.  Subacute maxillary sinusitis-Rx'd Doxycycline; 2.  Cough-Rx'd Tessalon; 3.  Allergic rhinitis-Rx'd Allegra. Advised patient to take medication as directed with food to completion.  Advised patient to take Allegra with Doxycycline daily for the next 5 of 7 days.  Advised may use Allegra as needed afterwards for concurrent postnasal drainage/drip.  Advised may use Tessalon Perles daily or as needed for cough.  Encouraged patient to increase daily water intake to 64 ounces per day while taking these medications.  Advised we will follow-up with COVID-19/Influenza results once received. If symptoms worsen and/or unresolved please follow-up with PCP or here for further evaluation. Final Clinical Impressions(s) / UC Diagnoses   Final diagnoses:  Cough, unspecified type  Subacute maxillary sinusitis  Allergic rhinitis, unspecified seasonality, unspecified trigger     Discharge Instructions      Advised patient to take medication as directed with food to completion.  Advised patient to take Allegra with Doxycycline daily for the next 5 of 7  days.  Advised may use Allegra as needed afterwards for concurrent postnasal drainage/drip.  Advised may use Tessalon Perles daily or as needed for cough.  Encouraged patient to increase daily water intake to 64 ounces per day while taking these medications.  Advised we will follow-up with COVID-19/influenza results once received.     ED Prescriptions     Medication Sig Dispense Auth. Provider   doxycycline (VIBRAMYCIN) 100 MG capsule Take 1 capsule (100 mg total) by mouth 2 (two) times daily for 7 days. 14 capsule Trevor Iha, FNP   fexofenadine [facility] ALLERGY) 180 MG tablet Take 1 tablet (180 mg total) by mouth daily for 15 days. 15 tablet Trevor Iha, FNP   benzonatate (TESSALON) 200 MG capsule Take 1 capsule (200 mg total) by mouth 3 (three) times daily as needed for up to 7 days. 40 capsule Trevor Iha, FNP      PDMP not reviewed this encounter.   Trevor Iha, FNP 09/20/22 [phone]

## 2022-09-20 NOTE — ED Triage Notes (Signed)
Patient c/o cough, congestion, nasal drainage and swollen lymph nodes x 1 week.  Patient has taken a home COVID test that was negative.  Patient has taken Tylenol Cold & Chest congestion and Mucinex.

## 2022-09-23 ENCOUNTER — Other Ambulatory Visit: Payer: Self-pay

## 2022-09-23 DIAGNOSIS — I480 Paroxysmal atrial fibrillation: Secondary | ICD-10-CM

## 2022-10-12 ENCOUNTER — Encounter: Payer: Self-pay | Admitting: Cardiology

## 2022-10-21 NOTE — Telephone Encounter (Signed)
The reason for referral to electrophysiology was to give you the option whether or not you wanted to do the ablation if they felt it was a good opportunity.  Ablation theoretically would obviate the need for using flecainide.  You probably will need to be on the blood thinner for 3 to 6 months afterwards depending on the initial outcome.  The whole intention is for you do not have to be on medications that can potentially make you feel poorly.  I tend to think that if medications are not fully functioning to keep you out of atrial fibrillation and being symptomatic from the medications or A-fib, then it makes sense to go ahead proceed with ablation.  [place]

## 2022-10-30 ENCOUNTER — Other Ambulatory Visit: Payer: Self-pay | Admitting: Internal Medicine

## 2022-10-30 DIAGNOSIS — Z1231 Encounter for screening mammogram for malignant neoplasm of breast: Secondary | ICD-10-CM

## 2022-11-08 ENCOUNTER — Telehealth: Payer: Self-pay | Admitting: Cardiology

## 2022-11-08 ENCOUNTER — Encounter: Payer: Self-pay | Admitting: Cardiology

## 2022-11-08 NOTE — Telephone Encounter (Signed)
Left message for patient to callback to schedule lab appointment prior to Cardiac CT scan. Would need labs around 11/26/2022, orders in Epic for CBC/BMET.

## 2022-11-25 ENCOUNTER — Ambulatory Visit: Payer: Medicare PPO | Attending: Cardiology

## 2022-11-25 DIAGNOSIS — I480 Paroxysmal atrial fibrillation: Secondary | ICD-10-CM

## 2022-11-26 ENCOUNTER — Telehealth: Payer: Self-pay

## 2022-11-26 ENCOUNTER — Other Ambulatory Visit: Payer: Self-pay

## 2022-11-26 ENCOUNTER — Encounter: Payer: Self-pay | Admitting: Cardiology

## 2022-11-26 DIAGNOSIS — I480 Paroxysmal atrial fibrillation: Secondary | ICD-10-CM

## 2022-11-26 LAB — BASIC METABOLIC PANEL
BUN/Creatinine Ratio: 14 (ref 12–28)
BUN: 16 mg/dL (ref 8–27)
CO2: 24 mmol/L (ref 20–29)
Calcium: 9.4 mg/dL (ref 8.7–10.3)
Chloride: 104 mmol/L (ref 96–106)
Creatinine, Ser: 1.12 mg/dL — ABNORMAL HIGH (ref 0.57–1.00)
Glucose: 97 mg/dL (ref 70–99)
Potassium: 4.8 mmol/L (ref 3.5–5.2)
Sodium: 141 mmol/L (ref 134–144)
eGFR: 53 mL/min/{1.73_m2} — ABNORMAL LOW (ref >59)

## 2022-11-26 NOTE — Telephone Encounter (Signed)
I called pt to schedule her labs for her upcoming procedure. She stated that she had come in the day before on 2/5. The only lab done that day was a BMET.   I spoke with our lab techs and they stated that when the pt come in that was the only one the front desk released when they checked the pt in and it was not noted in the appt notes as it should be.   Since the pt had the BMET done and that can be used for the CT Scan, I advised her to get the CBC done at [facility] while she was there for her CT.   The lab order has been placed.

## 2022-11-26 NOTE — Telephone Encounter (Signed)
I have faxed labs to Dr. Doristine Bosworth.

## 2022-12-09 ENCOUNTER — Telehealth (HOSPITAL_COMMUNITY): Payer: Self-pay | Admitting: Emergency Medicine

## 2022-12-09 ENCOUNTER — Telehealth (HOSPITAL_COMMUNITY): Payer: Self-pay | Admitting: *Deleted

## 2022-12-09 ENCOUNTER — Other Ambulatory Visit (HOSPITAL_COMMUNITY): Payer: Self-pay | Admitting: *Deleted

## 2022-12-09 DIAGNOSIS — I480 Paroxysmal atrial fibrillation: Secondary | ICD-10-CM

## 2022-12-09 NOTE — Telephone Encounter (Signed)
Patient returning call about her upcoming cardiac imaging study; pt verbalizes understanding of appt date/time, parking situation and where to check in, pre-test NPO status  and verified current allergies; name and call back number provided for further questions should they arise  Gordy Clement RN Navigator Cardiac Imaging [facility] [phone] office [phone] cell  Patient to take her daily medications.

## 2022-12-09 NOTE — Telephone Encounter (Signed)
Attempted to call patient regarding upcoming cardiac CT appointment. °Left message on voicemail with name and callback number °Taleshia Luff RN Navigator Cardiac Imaging °[facility] °[phone] Office °[phone] Cell ° °

## 2022-12-10 ENCOUNTER — Inpatient Hospital Stay (HOSPITAL_BASED_OUTPATIENT_CLINIC_OR_DEPARTMENT_OTHER): Admission: RE | Admit: 2022-12-10 | Payer: Medicare PPO | Source: Ambulatory Visit

## 2022-12-10 ENCOUNTER — Encounter (HOSPITAL_BASED_OUTPATIENT_CLINIC_OR_DEPARTMENT_OTHER): Payer: Self-pay

## 2022-12-10 ENCOUNTER — Ambulatory Visit (HOSPITAL_BASED_OUTPATIENT_CLINIC_OR_DEPARTMENT_OTHER)
Admission: RE | Admit: 2022-12-10 | Discharge: 2022-12-10 | Disposition: A | Discharge status: Home / Self Care | Payer: Medicare PPO | Source: Ambulatory Visit | Attending: Cardiology | Admitting: Cardiology

## 2022-12-10 DIAGNOSIS — I480 Paroxysmal atrial fibrillation: Secondary | ICD-10-CM | POA: Diagnosis not present

## 2022-12-10 MED ORDER — IOHEXOL 350 MG/ML SOLN
100.0000 mL | Freq: Once | INTRAVENOUS | Status: AC | PRN
  Administered 2022-12-10: 80 mL via INTRAVENOUS

## 2022-12-11 LAB — CBC
Hematocrit: 38.9 % (ref 34.0–46.6)
Hemoglobin: 12.7 g/dL (ref 11.1–15.9)
MCH: 28.7 pg (ref 26.6–33.0)
MCHC: 32.6 g/dL (ref 31.5–35.7)
MCV: 88 fL (ref 79–97)
Platelets: 266 10*3/uL (ref 150–450)
RBC: 4.42 x10E6/uL (ref 3.77–5.28)
RDW: 13.1 % (ref 11.7–15.4)
WBC: 8.3 10*3/uL (ref 3.4–10.8)

## 2022-12-13 NOTE — Pre-Procedure Instructions (Signed)
Instructed patient on the following items: Arrival time 0830 Nothing to eat or drink after midnight No meds AM of procedure Responsible person to drive you home and stay with you for 24 hrs  Have you missed any doses of anti-coagulant Eliquis- hasn't missed any doses   

## 2022-12-16 ENCOUNTER — Telehealth: Payer: Self-pay | Admitting: Cardiology

## 2022-12-16 ENCOUNTER — Encounter (HOSPITAL_COMMUNITY)
Admission: RE | Disposition: A | Discharge status: Home / Self Care | Payer: Self-pay | Source: Home / Self Care | Attending: Cardiology

## 2022-12-16 ENCOUNTER — Other Ambulatory Visit: Payer: Self-pay

## 2022-12-16 ENCOUNTER — Ambulatory Visit (HOSPITAL_COMMUNITY): Payer: Medicare PPO | Admitting: Anesthesiology

## 2022-12-16 ENCOUNTER — Ambulatory Visit (HOSPITAL_COMMUNITY)
Admission: RE | Admit: 2022-12-16 | Discharge: 2022-12-16 | Disposition: A | Discharge status: Home / Self Care | Payer: Medicare PPO | Attending: Cardiology | Admitting: Cardiology

## 2022-12-16 DIAGNOSIS — E785 Hyperlipidemia, unspecified: Secondary | ICD-10-CM | POA: Insufficient documentation

## 2022-12-16 DIAGNOSIS — E039 Hypothyroidism, unspecified: Secondary | ICD-10-CM | POA: Diagnosis not present

## 2022-12-16 DIAGNOSIS — I480 Paroxysmal atrial fibrillation: Secondary | ICD-10-CM

## 2022-12-16 DIAGNOSIS — Z539 Procedure and treatment not carried out, unspecified reason: Secondary | ICD-10-CM | POA: Insufficient documentation

## 2022-12-16 SURGERY — ATRIAL FIBRILLATION ABLATION
Anesthesia: General

## 2022-12-16 MED ORDER — SODIUM CHLORIDE 0.9 % IV SOLN: INTRAVENOUS | Status: DC

## 2022-12-16 NOTE — Progress Notes (Signed)
Patient arrived with a tic on her left leg. MD removed tic. Procedural canceled, no antibiotic given per MD. Patient will be going home. IV removed.

## 2022-12-16 NOTE — Telephone Encounter (Signed)
Pt has been rescheduled for 01/27/23 @ 1:30

## 2022-12-16 NOTE — Anesthesia Preprocedure Evaluation (Deleted)
Anesthesia Evaluation  Patient identified by MRN, date of birth, ID band Patient awake    Reviewed: Allergy & Precautions, NPO status , Patient's Chart, lab work & pertinent test results  History of Anesthesia Complications Negative for: history of anesthetic complications  Airway Mallampati: III  TM Distance: >3 FB Neck ROM: Full    Dental  (+) Dental Advisory Given, Teeth Intact   Pulmonary former smoker   Pulmonary exam normal        Cardiovascular + dysrhythmias Atrial Fibrillation  Rhythm:Irregular Rate:Normal   '23 TTE - EF 65 to 70%. Grade I diastolic dysfunction (impaired relaxation). Trivial mitral valve regurgitation. Aortic valve regurgitation is mild.      Neuro/Psych negative neurological ROS  negative psych ROS   GI/Hepatic negative GI ROS, Neg liver ROS,,,  Endo/Other  Hypothyroidism   Graves disease s/p RAI ablation   Renal/GU negative Renal ROS     Musculoskeletal negative musculoskeletal ROS (+)    Abdominal   Peds  Hematology  On eliquis    Anesthesia Other Findings   Reproductive/Obstetrics                             Anesthesia Physical Anesthesia Plan  ASA: 3  Anesthesia Plan: General   Post-op Pain Management: Tylenol PO (pre-op)*   Induction: Intravenous  PONV Risk Score and Plan: 3 and Treatment may vary due to age or medical condition and Ondansetron  Airway Management Planned: Oral ETT  Additional Equipment: None  Intra-op Plan:   Post-operative Plan: Extubation in OR  Informed Consent: I have reviewed the patients History and Physical, chart, labs and discussed the procedure including the risks, benefits and alternatives for the proposed anesthesia with the patient or authorized representative who has indicated his/her understanding and acceptance.     Dental advisory given  Plan Discussed with: CRNA and Anesthesiologist  Anesthesia  Plan Comments:         Anesthesia Quick Evaluation

## 2022-12-16 NOTE — H&P (Addendum)
Electrophysiology Office Note:     Date:  12/16/2022    ID:  Tameaka, Cannoy 03-19-1951, MRN [id number]   PCP:  Kelton Pillar, MD     [facility] Cardiologist:  Glenetta Hew, MD  [facility] Electrophysiologist:  Vickie Epley, MD    Referring MD: Leonie Man, MD    Chief Complaint: Atrial fibrillation   History of Present Illness:     Nicole Haynes is a 72 y.o. female who presents for an evaluation of atrial fibrillation at the request of Dr. Ellyn Hack. Their medical history includes atrial fibrillation, hypothyroidism, hyperlipidemia.  The patient was previously treated with flecainide for her atrial fibrillation but this was stopped due to conduction system disease.  She actually restarted it given her recurrence of symptoms associate with her atrial fibrillation but desires a long-term strategy of atrial fibrillation management that would avoid prolonged use of antiarrhythmic drug such as flecainide.  She takes Eliquis for stroke prophylaxis without bleeding concerns.   Presents for PVI today.     Objective      Past Medical History:  Diagnosis Date   Dyslipidemia      On rosuvastatin, Krill oral   Hypothyroidism due to Graves' disease status post radiation iodide      On levothyroxine   Paroxysmal atrial fibrillation (HCC)      Rate controlled with diltiazem, rhythm control with flecainide. CHA2DS2Vasc = 2; on Eliquis           Past Surgical History:  Procedure Laterality Date   CARDIAC EVENT MONITOR   11/2017    30 monitor: 98% normal sinus rhythm.  2% PAF with rates as high as 160 bpm.   NM MYOVIEW LTD   12/2017    LOW RISK.  Walk for 9 minutes.  Peak heart rate 179 (116% of MPHR).  No EKG changes to suggest ischemia.  No ischemia or infarction noted on Myoview.  EF 65-70%.   None       TRANSTHORACIC ECHOCARDIOGRAM   11/28/2017    Normal LV size and function.  EF 60-65%.  Wall motion.  Mild AI, Mild-mod TR with mildly elevated PA  pressures.      Current Medications: Active Medications      Current Meds  Medication Sig   Bacillus Coagulans-Inulin (PROBIOTIC) 1-250 BILLION-MG CAPS     diltiazem (CARDIZEM CD) 120 MG 24 hr capsule TAKE 1 CAPSULE BY MOUTH EVERY DAY WHEN USING FLECAINIDE AS DIRECTED   ELIQUIS 5 MG TABS tablet TAKE 1 TABLET BY MOUTH TWICE A DAY   flecainide (TAMBOCOR) 50 MG tablet IF YOU DEVELOP AFIB - FLECAINIDE TAKE 200 MG DOSE AT THAT TIME THEN ABOUT 12 HOURS LATER START TAKING 100 MG TWICE A DAY UNTIL RHYTHM BREAKS . ONCE IT BREAKS TAKE 50 MG TWICE A DAY FOR 2 WEEKS THEN STOP UNTIL NEXT EPISODE. AS NEEDED   levothyroxine (SYNTHROID) 88 MCG tablet TAKE 1 CAPSULE BY MOUTH EVERY DAY IN THE MORNING ON EMPTY STOMACH   rosuvastatin (CRESTOR) 5 MG tablet Take 5 mg by mouth daily.        Allergies:   Penicillins    Social History         Socioeconomic History   Marital status: Married      Spouse name: Not on file   Number of children: 2   Years of education: Not on file   Highest education level: Not on file  Occupational History   Occupation: Pharmacist, hospital  Comment: Retired  Tobacco Use   Smoking status: Former   Smokeless tobacco: Never  Substance and Sexual Activity   Alcohol use: Yes      Comment: wine   Drug use: No   Sexual activity: Not on file  Other Topics Concern   Not on file  Social History Narrative    She is a married mother of 2.    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    She exercises about 3 days a week walking about 10 miles a week total.  She also enjoys yoga and routine hiking.  (Dedicated walking 60 minutes 2 days a week)    1-2 glasses of alcohol/wine per week.    Caffeine 2-1/2 cups daily.    Non-smoker but history of exposure to passive smoke.    Social Determinants of Health    Financial Resource Strain: Not on file  Food Insecurity: Not on file  Transportation Needs: Not on file  Physical Activity: Not on file  Stress: Not on file   Social Connections: Not on file      Family History: The patient's family history is negative for Breast cancer.   ROS:   Please see the history of present illness.    All other systems reviewed and are negative.   EKGs/Labs/Other Studies Reviewed:     The following studies were reviewed today:     EKG:  The ekg from September 27 shows sinus rhythm with a normal PR interval.     Recent Labs: 01/18/2022: ALT 20; BUN 16; Creatinine, Ser 1.10; Hemoglobin 13.9; Platelets 260; Potassium 5.0; Sodium 142  Recent Lipid Panel Labs (Brief)  No results found for: "CHOL", "TRIG", "HDL", "CHOLHDL", "VLDL", "LDLCALC", "LDLDIRECT"     Physical Exam:     VS: Reviewed.         Wt Readings from Last 3 Encounters:  09/16/22 149 lb (67.6 kg)  07/17/22 148 lb 6.4 oz (67.3 kg)  01/18/22 152 lb (68.9 kg)      GEN:  Well nourished, well developed in no acute distress HEENT: Normal NECK: No JVD; No carotid bruits LYMPHATICS: No lymphadenopathy CARDIAC: RRR, no murmurs, rubs, gallops RESPIRATORY:  Clear to auscultation without rales, wheezing or rhonchi  ABDOMEN: Soft, non-tender, non-distended MUSCULOSKELETAL:  No edema; No deformity  SKIN: Warm and dry. Small, round, brown tick attached/partially buried in the left inguinal fold. Small amount of surrounding erythema. NEUROLOGIC:  Alert and oriented x 3 PSYCHIATRIC:  Normal affect          Assessment ASSESSMENT:     1. PAF (paroxysmal atrial fibrillation) (HCC)     PLAN:     In order of problems listed above:   #Paroxysmal atrial fibrillation Symptomatic.  Is on low-dose flecainide with reasonable control of her symptoms.  I does flecainide this not an option given the development of conduction system disease in the past.   Discussed treatment options today for their AF including antiarrhythmic drug therapy and ablation. Discussed risks, recovery and likelihood of success. Discussed potential need for repeat ablation procedures  and antiarrhythmic drugs after an initial ablation.    Risk, benefits, and alternatives to EP study and radiofrequency ablation for afib were also discussed in detail today. These risks include but are not limited to stroke, bleeding, vascular damage, tamponade, perforation, damage to the esophagus, lungs, and other structures, pulmonary vein stenosis, worsening renal function, and death. The patient understands these risks.  Carto, ICE, anesthesia are requested for  the procedure.  Will also obtain CT PV protocol prior to the procedure to exclude LAA thrombus and further evaluate atrial anatomy.     Presents for PVI today. Unfortunately, she has a tick newly attached to the left inguinal fold in the immediate area of vascular access. I will postpone today's procedure. I spoke with ID who recommended no prophylactic antibiotics and primary care follow up.  We will reschedule her ablation procedure.    Signed, Hilton Cork. Quentin Ore, MD, [facility], [facility] 12/16/2022    Electrophysiology [facility]

## 2022-12-16 NOTE — Telephone Encounter (Signed)
Follow Up:      Patient is returning a call from April, concerning rescheduling her procedure for today.

## 2022-12-20 ENCOUNTER — Ambulatory Visit
Admission: RE | Admit: 2022-12-20 | Discharge: 2022-12-20 | Disposition: A | Discharge status: Home / Self Care | Payer: Medicare PPO | Source: Ambulatory Visit | Attending: Internal Medicine | Admitting: Internal Medicine

## 2022-12-20 DIAGNOSIS — Z1231 Encounter for screening mammogram for malignant neoplasm of breast: Secondary | ICD-10-CM

## 2023-01-02 ENCOUNTER — Other Ambulatory Visit: Payer: Self-pay | Admitting: Cardiology

## 2023-01-02 NOTE — Telephone Encounter (Signed)
Prescription refill request for Eliquis received. Indication: PAF Last office visit: 09/16/22  Grayce Sessions MD Scr: 1.12 on 11/25/22 Age: 72 Weight: 67.6kg  Based on above findings Eliquis '5mg'$  twice daily is the appropriate dose.  Refill approved.

## 2023-01-13 ENCOUNTER — Ambulatory Visit (HOSPITAL_COMMUNITY): Payer: Medicare PPO | Admitting: Physician Assistant

## 2023-01-13 ENCOUNTER — Ambulatory Visit: Payer: Medicare PPO | Attending: Cardiology

## 2023-01-13 DIAGNOSIS — I480 Paroxysmal atrial fibrillation: Secondary | ICD-10-CM

## 2023-01-18 LAB — CBC
Hematocrit: 41.2 % (ref 34.0–46.6)
Hemoglobin: 13.8 g/dL (ref 11.1–15.9)
MCH: 29.2 pg (ref 26.6–33.0)
MCHC: 33.5 g/dL (ref 31.5–35.7)
MCV: 87 fL (ref 79–97)
Platelets: 263 10*3/uL (ref 150–450)
RBC: 4.73 x10E6/uL (ref 3.77–5.28)
RDW: 12.9 % (ref 11.7–15.4)
WBC: 7.4 10*3/uL (ref 3.4–10.8)

## 2023-01-18 LAB — BASIC METABOLIC PANEL
BUN/Creatinine Ratio: 13 (ref 12–28)
BUN: 15 mg/dL (ref 8–27)
CO2: 24 mmol/L (ref 20–29)
Calcium: 9.8 mg/dL (ref 8.7–10.3)
Chloride: 102 mmol/L (ref 96–106)
Creatinine, Ser: 1.2 mg/dL — ABNORMAL HIGH (ref 0.57–1.00)
Glucose: 75 mg/dL (ref 70–99)
Potassium: 4.8 mmol/L (ref 3.5–5.2)
Sodium: 141 mmol/L (ref 134–144)
eGFR: 48 mL/min/{1.73_m2} — ABNORMAL LOW (ref >59)

## 2023-01-24 NOTE — Pre-Procedure Instructions (Signed)
Instructed patient on the following items: Arrival time 1130 Nothing to eat or drink after midnight No meds AM of procedure Responsible person to drive you home and stay with you for 24 hrs  Have you missed any doses of anti-coagulant Eliquis- takes twice a day, hasn't missed any doses, don't take dose am of procedure.

## 2023-01-27 ENCOUNTER — Ambulatory Visit (HOSPITAL_COMMUNITY)
Admission: RE | Admit: 2023-01-27 | Discharge: 2023-01-27 | Disposition: A | Discharge status: Home / Self Care | Payer: Medicare PPO | Attending: Cardiology | Admitting: Cardiology

## 2023-01-27 ENCOUNTER — Ambulatory Visit (HOSPITAL_COMMUNITY): Payer: Medicare PPO | Admitting: Anesthesiology

## 2023-01-27 ENCOUNTER — Other Ambulatory Visit: Payer: Self-pay

## 2023-01-27 ENCOUNTER — Encounter (HOSPITAL_COMMUNITY)
Admission: RE | Disposition: A | Discharge status: Home / Self Care | Payer: Self-pay | Source: Home / Self Care | Attending: Cardiology

## 2023-01-27 ENCOUNTER — Encounter (HOSPITAL_COMMUNITY): Payer: Self-pay | Admitting: Cardiology

## 2023-01-27 ENCOUNTER — Ambulatory Visit (HOSPITAL_BASED_OUTPATIENT_CLINIC_OR_DEPARTMENT_OTHER): Payer: Medicare PPO | Admitting: Anesthesiology

## 2023-01-27 DIAGNOSIS — Z87891 Personal history of nicotine dependence: Secondary | ICD-10-CM | POA: Diagnosis not present

## 2023-01-27 DIAGNOSIS — Z7901 Long term (current) use of anticoagulants: Secondary | ICD-10-CM | POA: Insufficient documentation

## 2023-01-27 DIAGNOSIS — E039 Hypothyroidism, unspecified: Secondary | ICD-10-CM | POA: Diagnosis not present

## 2023-01-27 DIAGNOSIS — I480 Paroxysmal atrial fibrillation: Secondary | ICD-10-CM | POA: Insufficient documentation

## 2023-01-27 DIAGNOSIS — Z79899 Other long term (current) drug therapy: Secondary | ICD-10-CM | POA: Insufficient documentation

## 2023-01-27 DIAGNOSIS — I4891 Unspecified atrial fibrillation: Secondary | ICD-10-CM

## 2023-01-27 DIAGNOSIS — E785 Hyperlipidemia, unspecified: Secondary | ICD-10-CM | POA: Diagnosis not present

## 2023-01-27 HISTORY — PX: ATRIAL FIBRILLATION ABLATION: EP1191

## 2023-01-27 LAB — POCT ACTIVATED CLOTTING TIME: Activated Clotting Time: 282 seconds

## 2023-01-27 SURGERY — ATRIAL FIBRILLATION ABLATION
Anesthesia: General

## 2023-01-27 MED ORDER — ACETAMINOPHEN 325 MG PO TABS: 650.0000 mg | ORAL_TABLET | ORAL | Status: DC | PRN

## 2023-01-27 MED ORDER — HEPARIN (PORCINE) IN NACL 1000-0.9 UT/500ML-% IV SOLN
INTRAVENOUS | Status: DC | PRN
  Administered 2023-01-27 (×3): 500 mL

## 2023-01-27 MED ORDER — PROPOFOL 10 MG/ML IV BOLUS
INTRAVENOUS | Status: DC | PRN
  Administered 2023-01-27: 120 mg via INTRAVENOUS

## 2023-01-27 MED ORDER — COLCHICINE 0.6 MG PO TABS
0.6000 mg | ORAL_TABLET | Freq: Two times a day (BID) | ORAL | Status: DC
  Administered 2023-01-27: 0.6 mg via ORAL
  Filled 2023-01-27: qty 1

## 2023-01-27 MED ORDER — SODIUM CHLORIDE 0.9% FLUSH: 3.0000 mL | INTRAVENOUS | Status: DC | PRN

## 2023-01-27 MED ORDER — HEPARIN SODIUM (PORCINE) 1000 UNIT/ML IJ SOLN
INTRAMUSCULAR | Status: DC | PRN
  Administered 2023-01-27: 4000 [IU] via INTRAVENOUS
  Administered 2023-01-27: 10000 [IU] via INTRAVENOUS

## 2023-01-27 MED ORDER — ONDANSETRON HCL 4 MG/2ML IJ SOLN
INTRAMUSCULAR | Status: DC | PRN
  Administered 2023-01-27: 4 mg via INTRAVENOUS

## 2023-01-27 MED ORDER — HEPARIN SODIUM (PORCINE) 1000 UNIT/ML IJ SOLN
INTRAMUSCULAR | Status: AC
  Filled 2023-01-27: qty 10

## 2023-01-27 MED ORDER — PHENYLEPHRINE HCL-NACL 20-0.9 MG/250ML-% IV SOLN
INTRAVENOUS | Status: DC | PRN
  Administered 2023-01-27: 30 ug/min via INTRAVENOUS

## 2023-01-27 MED ORDER — PANTOPRAZOLE SODIUM 40 MG PO TBEC
40.0000 mg | DELAYED_RELEASE_TABLET | Freq: Every day | ORAL | Status: DC
  Administered 2023-01-27: 40 mg via ORAL
  Filled 2023-01-27: qty 1

## 2023-01-27 MED ORDER — COLCHICINE 0.6 MG PO TABS: 0.6000 mg | ORAL_TABLET | Freq: Two times a day (BID) | ORAL | 0 refills | Status: DC

## 2023-01-27 MED ORDER — SODIUM CHLORIDE 0.9 % IV SOLN: INTRAVENOUS | Status: DC

## 2023-01-27 MED ORDER — APIXABAN 5 MG PO TABS
5.0000 mg | ORAL_TABLET | Freq: Two times a day (BID) | ORAL | Status: DC
  Administered 2023-01-27: 5 mg via ORAL
  Filled 2023-01-27: qty 1

## 2023-01-27 MED ORDER — DEXAMETHASONE SODIUM PHOSPHATE 10 MG/ML IJ SOLN
INTRAMUSCULAR | Status: DC | PRN
  Administered 2023-01-27: 10 mg via INTRAVENOUS

## 2023-01-27 MED ORDER — SUGAMMADEX SODIUM 200 MG/2ML IV SOLN
INTRAVENOUS | Status: DC | PRN
  Administered 2023-01-27: 200 mg via INTRAVENOUS

## 2023-01-27 MED ORDER — ROCURONIUM BROMIDE 10 MG/ML (PF) SYRINGE
PREFILLED_SYRINGE | INTRAVENOUS | Status: DC | PRN
  Administered 2023-01-27: 50 mg via INTRAVENOUS

## 2023-01-27 MED ORDER — PANTOPRAZOLE SODIUM 40 MG PO TBEC: 40.0000 mg | DELAYED_RELEASE_TABLET | Freq: Every day | ORAL | 0 refills | Status: DC

## 2023-01-27 MED ORDER — PROTAMINE SULFATE 10 MG/ML IV SOLN
INTRAVENOUS | Status: DC | PRN
  Administered 2023-01-27: 30 mg via INTRAVENOUS

## 2023-01-27 MED ORDER — SODIUM CHLORIDE 0.9 % IV SOLN: 250.0000 mL | INTRAVENOUS | Status: DC | PRN

## 2023-01-27 MED ORDER — LIDOCAINE 2% (20 MG/ML) 5 ML SYRINGE
INTRAMUSCULAR | Status: DC | PRN
  Administered 2023-01-27: 60 mg via INTRAVENOUS

## 2023-01-27 MED ORDER — HEPARIN SODIUM (PORCINE) 1000 UNIT/ML IJ SOLN
INTRAMUSCULAR | Status: DC | PRN
  Administered 2023-01-27: 1000 [IU] via INTRAVENOUS

## 2023-01-27 MED ORDER — FENTANYL CITRATE (PF) 250 MCG/5ML IJ SOLN
INTRAMUSCULAR | Status: DC | PRN
  Administered 2023-01-27: 25 ug via INTRAVENOUS
  Administered 2023-01-27: 50 ug via INTRAVENOUS
  Administered 2023-01-27: 25 ug via INTRAVENOUS

## 2023-01-27 MED ORDER — SODIUM CHLORIDE 0.9% FLUSH: 3.0000 mL | Freq: Two times a day (BID) | INTRAVENOUS | Status: DC

## 2023-01-27 MED ORDER — ACETAMINOPHEN 500 MG PO TABS
1000.0000 mg | ORAL_TABLET | Freq: Once | ORAL | Status: AC
  Administered 2023-01-27: 1000 mg via ORAL
  Filled 2023-01-27: qty 2

## 2023-01-27 SURGICAL SUPPLY — 20 items
BAG SNAP BAND KOVER 36X36 (MISCELLANEOUS) IMPLANT
BLANKET WARM UNDERBOD FULL ACC (MISCELLANEOUS) ×1 IMPLANT
CATH 8FR REPROCESSED SOUNDSTAR (CATHETERS) ×1 IMPLANT
CATH 8FR SOUNDSTAR REPROCESSED (CATHETERS) IMPLANT
CATH ABLAT QDOT MICRO BI TC DF (CATHETERS) IMPLANT
CATH OCTARAY 2.0 F 3-3-3-3-3 (CATHETERS) IMPLANT
CATH S-M CIRCA TEMP PROBE (CATHETERS) IMPLANT
CATH WEBSTER BI DIR CS D-F CRV (CATHETERS) IMPLANT
CLOSURE PERCLOSE PROSTYLE (VASCULAR PRODUCTS) IMPLANT
COVER SWIFTLINK CONNECTOR (BAG) ×1 IMPLANT
PACK EP LATEX FREE (CUSTOM PROCEDURE TRAY) ×1
PACK EP LF (CUSTOM PROCEDURE TRAY) ×1 IMPLANT
PAD DEFIB RADIO PHYSIO CONN (PAD) ×1 IMPLANT
PATCH CARTO3 (PAD) IMPLANT
SHEATH BAYLIS TRANSSEPTAL 98CM (NEEDLE) IMPLANT
SHEATH CARTO VIZIGO SM CVD (SHEATH) IMPLANT
SHEATH PINNACLE 8F 10CM (SHEATH) IMPLANT
SHEATH PINNACLE 9F 10CM (SHEATH) IMPLANT
SHEATH PROBE COVER 6X72 (BAG) IMPLANT
TUBING SMART ABLATE COOLFLOW (TUBING) IMPLANT

## 2023-01-27 NOTE — Anesthesia Procedure Notes (Signed)
Procedure Name: Intubation Date/Time: 01/27/2023 1:00 PM  Performed by: Quentin Ore, CRNAPre-anesthesia Checklist: Patient identified, Emergency Drugs available, Suction available and Patient being monitored Patient Re-evaluated:Patient Re-evaluated prior to induction Oxygen Delivery Method: Circle system utilized Preoxygenation: Pre-oxygenation with 100% oxygen Induction Type: IV induction Ventilation: Mask ventilation without difficulty Laryngoscope Size: Glidescope and 3 Tube type: Oral Tube size: 7.0 mm Number of attempts: 2 Airway Equipment and Method: Video-laryngoscopy Placement Confirmation: positive ETCO2 and breath sounds checked- equal and bilateral Secured at: 20 cm Tube secured with: Tape Dental Injury: Teeth and Oropharynx as per pre-operative assessment  Comments: DL x1 with MAC3, unable to visualize vocal cords. 2nd DL with Glidescope Go, Grade I view on screen.

## 2023-01-27 NOTE — Anesthesia Preprocedure Evaluation (Addendum)
Anesthesia Evaluation  Patient identified by MRN, date of birth, ID band Patient awake    Reviewed: Allergy & Precautions, H&P , NPO status , Patient's Chart, lab work & pertinent test results  Airway Mallampati: II  TM Distance: >3 FB Neck ROM: Full    Dental no notable dental hx. (+) Teeth Intact, Dental Advisory Given   Pulmonary former smoker   Pulmonary exam normal breath sounds clear to auscultation       Cardiovascular + DOE  + dysrhythmias Atrial Fibrillation  Rhythm:Regular Rate:Normal     Neuro/Psych negative neurological ROS  negative psych ROS   GI/Hepatic negative GI ROS, Neg liver ROS,,,  Endo/Other  Hypothyroidism    Renal/GU negative Renal ROS  negative genitourinary   Musculoskeletal   Abdominal   Peds  Hematology negative hematology ROS (+)   Anesthesia Other Findings   Reproductive/Obstetrics negative OB ROS                             Anesthesia Physical Anesthesia Plan  ASA: 3  Anesthesia Plan: General   Post-op Pain Management: Tylenol PO (pre-op)*   Induction: Intravenous  PONV Risk Score and Plan: 4 or greater and Ondansetron, Dexamethasone and Midazolam  Airway Management Planned: Oral ETT  Additional Equipment:   Intra-op Plan:   Post-operative Plan: Extubation in OR  Informed Consent: I have reviewed the patients History and Physical, chart, labs and discussed the procedure including the risks, benefits and alternatives for the proposed anesthesia with the patient or authorized representative who has indicated his/her understanding and acceptance.     Dental advisory given  Plan Discussed with: CRNA  Anesthesia Plan Comments:        Anesthesia Quick Evaluation

## 2023-01-27 NOTE — Discharge Instructions (Signed)

## 2023-01-27 NOTE — H&P (Signed)
Electrophysiology Office Note:     Date:  01/27/2023    ID:  Nicole Haynes, DOB 09/07/1951, MRN [id number]   PCP:  Maurice Small, MD     [facility] Cardiologist:  [name], MD  [facility] Electrophysiologist:  [name], MD    Referring MD: Marykay Lex, MD    Chief Complaint: Atrial fibrillation   History of Present Illness:     Nicole Haynes is a 72 y.o. female who presents for an evaluation of atrial fibrillation at the request of Dr. Herbie Baltimore. Their medical history includes atrial fibrillation, hypothyroidism, hyperlipidemia.  The patient was previously treated with flecainide for her atrial fibrillation but this was stopped due to conduction system disease.  She actually restarted it given her recurrence of symptoms associate with her atrial fibrillation but desires a long-term strategy of atrial fibrillation management that would avoid prolonged use of antiarrhythmic drug such as flecainide.  She takes Eliquis for stroke prophylaxis without bleeding concerns.   Presents for PVI today.     Objective         Past Medical History:  Diagnosis Date   Dyslipidemia      On rosuvastatin, Krill oral   Hypothyroidism due to Graves' disease status post radiation iodide      On levothyroxine   Paroxysmal atrial fibrillation (HCC)      Rate controlled with diltiazem, rhythm control with flecainide. CHA2DS2Vasc = 2; on Eliquis               Past Surgical History:  Procedure Laterality Date   CARDIAC EVENT MONITOR   11/2017    30 monitor: 98% normal sinus rhythm.  2% PAF with rates as high as 160 bpm.   NM MYOVIEW LTD   12/2017    LOW RISK.  Walk for 9 minutes.  Peak heart rate 179 (116% of MPHR).  No EKG changes to suggest ischemia.  No ischemia or infarction noted on Myoview.  EF 65-70%.   None       TRANSTHORACIC ECHOCARDIOGRAM   11/28/2017    Normal LV size and function.  EF 60-65%.  Wall motion.  Mild AI, Mild-mod TR with mildly elevated PA  pressures.      Current Medications: Active Medications         Current Meds  Medication Sig   Bacillus Coagulans-Inulin (PROBIOTIC) 1-250 BILLION-MG CAPS     diltiazem (CARDIZEM CD) 120 MG 24 hr capsule TAKE 1 CAPSULE BY MOUTH EVERY DAY WHEN USING FLECAINIDE AS DIRECTED   ELIQUIS 5 MG TABS tablet TAKE 1 TABLET BY MOUTH TWICE A DAY   flecainide (TAMBOCOR) 50 MG tablet IF YOU DEVELOP AFIB - FLECAINIDE TAKE 200 MG DOSE AT THAT TIME THEN ABOUT 12 HOURS LATER START TAKING 100 MG TWICE A DAY UNTIL RHYTHM BREAKS . ONCE IT BREAKS TAKE 50 MG TWICE A DAY FOR 2 WEEKS THEN STOP UNTIL NEXT EPISODE. AS NEEDED   levothyroxine (SYNTHROID) 88 MCG tablet TAKE 1 CAPSULE BY MOUTH EVERY DAY IN THE MORNING ON EMPTY STOMACH   rosuvastatin (CRESTOR) 5 MG tablet Take 5 mg by mouth daily.        Allergies:   Penicillins    Social History             Socioeconomic History   Marital status: Married      Spouse name: Not on file   Number of children: 2   Years of education: Not on file   Highest  education level: Not on file  Occupational History   Occupation: Runner, broadcasting/film/videoTeacher      Comment: Retired  Tobacco Use   Smoking status: Former   Smokeless tobacco: Never  Substance and Sexual Activity   Alcohol use: Yes      Comment: wine   Drug use: No   Sexual activity: Not on file  Other Topics Concern   Not on file  Social History Narrative    She is a married mother of 2.    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    She exercises about 3 days a week walking about 10 miles a week total.  She also enjoys yoga and routine hiking.  (Dedicated walking 60 minutes 2 days a week)    1-2 glasses of alcohol/wine per week.    Caffeine 2-1/2 cups daily.    Non-smoker but history of exposure to passive smoke.    Social Determinants of Health    Financial Resource Strain: Not on file  Food Insecurity: Not on file  Transportation Needs: Not on file  Physical Activity: Not on file  Stress: Not on file   Social Connections: Not on file      Family History: The patient's family history is negative for Breast cancer.   ROS:   Please see the history of present illness.    All other systems reviewed and are negative.   EKGs/Labs/Other Studies Reviewed:     The following studies were reviewed today:     EKG:  The ekg from September 27 shows sinus rhythm with a normal PR interval.     Recent Labs: 01/18/2022: ALT 20; BUN 16; Creatinine, Ser 1.10; Hemoglobin 13.9; Platelets 260; Potassium 5.0; Sodium 142  Recent Lipid Panel Labs (Brief)  No results found for: "CHOL", "TRIG", "HDL", "CHOLHDL", "VLDL", "LDLCALC", "LDLDIRECT"      Physical Exam:     VS: Reviewed.           Wt Readings from Last 3 Encounters:  09/16/22 149 lb (67.6 kg)  07/17/22 148 lb 6.4 oz (67.3 kg)  01/18/22 152 lb (68.9 kg)      GEN:  Well nourished, well developed in no acute distress HEENT: Normal NECK: No JVD; No carotid bruits LYMPHATICS: No lymphadenopathy CARDIAC: RRR, no murmurs, rubs, gallops RESPIRATORY:  Clear to auscultation without rales, wheezing or rhonchi  ABDOMEN: Soft, non-tender, non-distended MUSCULOSKELETAL:  No edema; No deformity  SKIN: Warm and dry. Small, round, brown tick attached/partially buried in the left inguinal fold. Small amount of surrounding erythema. NEUROLOGIC:  Alert and oriented x 3 PSYCHIATRIC:  Normal affect          Assessment ASSESSMENT:     1. PAF (paroxysmal atrial fibrillation) (HCC)     PLAN:     In order of problems listed above:   #Paroxysmal atrial fibrillation Symptomatic.  Is on low-dose flecainide with reasonable control of her symptoms.  I does flecainide this not an option given the development of conduction system disease in the past.   Discussed treatment options today for their AF including antiarrhythmic drug therapy and ablation. Discussed risks, recovery and likelihood of success. Discussed potential need for repeat ablation  procedures and antiarrhythmic drugs after an initial ablation.    Risk, benefits, and alternatives to EP study and radiofrequency ablation for afib were also discussed in detail today. These risks include but are not limited to stroke, bleeding, vascular damage, tamponade, perforation, damage to the esophagus, lungs, and other structures,  pulmonary vein stenosis, worsening renal function, and death. The patient understands these risks.  Carto, ICE, anesthesia are requested for the procedure.  Will also obtain CT PV protocol prior to the procedure to exclude LAA thrombus and further evaluate atrial anatomy.     Presents for PVI today. Procedure reviewed. No problems with blood thinner.    Signed, Rossie Muskrat. Lalla Brothers, MD, [facility], [facility] 01/27/2023 Electrophysiology [facility]

## 2023-01-27 NOTE — Progress Notes (Signed)
Assumed care of pt at this time.

## 2023-01-27 NOTE — Transfer of Care (Signed)
Immediate Anesthesia Transfer of Care Note  Patient: Nicole Haynes  Procedure(s) Performed: ATRIAL FIBRILLATION ABLATION  Patient Location: PACU  Anesthesia Type:General  Level of Consciousness: awake, alert , and oriented  Airway & Oxygen Therapy: Patient Spontanous Breathing and Patient connected to nasal cannula oxygen  Post-op Assessment: Report given to RN, Post -op Vital signs reviewed and stable, and Patient moving all extremities  Post vital signs: Reviewed and stable  Last Vitals:  Vitals Value Taken Time  BP 117/66 01/27/23 1441  Temp 36.8 C 01/27/23 1440  Pulse 54 01/27/23 1445  Resp 17 01/27/23 1445  SpO2 99 % 01/27/23 1445  Vitals shown include unvalidated device data.  Last Pain:  Vitals:   01/27/23 1440  TempSrc: Temporal  PainSc: Asleep         Complications: No notable events documented.

## 2023-01-27 NOTE — Progress Notes (Signed)
  Sent in Colchicine 0.6mg  twice daily (for 5 days) and Protonix 40mg  once daily (for 45 days) to outpatient pharmacy at the request of EP after atrial fibrillation ablation today.  Corrin Parker, PA-C 01/27/2023 4:49 PM

## 2023-01-27 NOTE — Progress Notes (Deleted)
Cardiology Office Note:    Date:  01/27/2023   ID:  Nicole Haynes, DOB 1950/12/09, MRN [id number]  PCP:  [name], MD [facility] Cardiologist: [name], MD   Reason for visit: 17-month follow-up  History of Present Illness:    Nicole Haynes is a 72 y.o. female with a hx of atrial fibrillation.  In 2019, Patient had a low risk stress, echo with normal EF, normal LA size, mild AI, mild-mod TR.  Monitor showed 2% a-fib burden with ventricular rate to 160 bpm.  I last saw her in September 2023.  She was interested in getting off flecainide.  She was referred to EP for A-fib ablation.  She underwent A-fib ablation on January 27, 2023 with Dr. Lalla Brothers.  Today, ***  Paroxysmal atrial fibrillation -***  Hyperlipidemia with goal LDL less than 100 -LDL 91 in April 2023.  Continue Crestor.  Disposition - Follow-up in ***      Past Medical History:  Diagnosis Date   Dyslipidemia    On rosuvastatin, Krill oral   Hypothyroidism due to Graves' disease status post radiation iodide    On levothyroxine   Paroxysmal atrial fibrillation    Rate controlled with diltiazem, rhythm control with flecainide. CHA2DS2Vasc = 2; on Eliquis    Past Surgical History:  Procedure Laterality Date   CARDIAC EVENT MONITOR  11/2017   30 monitor: 98% normal sinus rhythm.  2% PAF with rates as high as 160 bpm.   NM MYOVIEW LTD  12/2017   LOW RISK.  Walk for 9 minutes.  Peak heart rate 179 (116% of MPHR).  No EKG changes to suggest ischemia.  No ischemia or infarction noted on Myoview.  EF 65-70%.   None     TRANSTHORACIC ECHOCARDIOGRAM  11/28/2017   Normal LV size and function.  EF 60-65%.  Wall motion.  Mild AI, Mild-mod TR with mildly elevated PA pressures.    Current Medications: No outpatient medications have been marked as taking for the 01/29/23 encounter (Appointment) with Cannon Kettle, PA-C.     Allergies:   Penicillins   Social History   Socioeconomic  History   Marital status: Married    Spouse name: Not on file   Number of children: 2   Years of education: Not on file   Highest education level: Not on file  Occupational History   Occupation: Runner, broadcasting/film/video    Comment: Retired  Tobacco Use   Smoking status: Former   Smokeless tobacco: Never  Building services engineer Use: Never used  Substance and Sexual Activity   Alcohol use: Yes    Comment: wine   Drug use: No   Sexual activity: Not Currently  Other Topics Concern   Not on file  Social History Narrative   She is a married mother of 2.   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   She exercises about 3 days a week walking about 10 miles a week total.  She also enjoys yoga and routine hiking.  (Dedicated walking 60 minutes 2 days a week)   1-2 glasses of alcohol/wine per week.   Caffeine 2-1/2 cups daily.   Non-smoker but history of exposure to passive smoke.   Social Determinants of Health   Financial Resource Strain: Not on file  Food Insecurity: Not on file  Transportation Needs: Not on file  Physical Activity: Not on file  Stress: Not on file  Social Connections: Not on file  Family History: The patient's family history is negative for Breast cancer.  ROS:   Please see the history of present illness.     EKGs/Labs/Other Studies Reviewed:    EKG:  The ekg ordered today demonstrates ***  Recent Labs: 01/13/2023: BUN 15; Creatinine, Ser 1.20; Hemoglobin 13.8; Platelets 263; Potassium 4.8; Sodium 141   Recent Lipid Panel No results found for: "CHOL", "TRIG", "HDL", "LDLCALC", "LDLDIRECT"  Physical Exam:    VS:  There were no vitals taken for this visit.   No data found.   Wt Readings from Last 3 Encounters:  01/27/23 146 lb (66.2 kg)  09/20/22 148 lb (67.1 kg)  09/16/22 149 lb (67.6 kg)     GEN: *** Well nourished, well developed in no acute distress HEENT: Normal NECK: No JVD; No carotid bruits CARDIAC: ***RRR, no murmurs, rubs,  gallops RESPIRATORY:  Clear to auscultation without rales, wheezing or rhonchi  ABDOMEN: Soft, non-tender, non-distended MUSCULOSKELETAL: No edema SKIN: Warm and dry NEUROLOGIC:  Alert and oriented PSYCHIATRIC:  Normal affect     ASSESSMENT AND PLAN   ***   {Are you ordering a CV Procedure (e.g. stress test, cath, DCCV, TEE, etc)?   Press F2        :[id number]}    Medication Adjustments/Labs and Tests Ordered: Current medicines are reviewed at length with the patient today.  Concerns regarding medicines are outlined above.  No orders of the defined types were placed in this encounter.  No orders of the defined types were placed in this encounter.   There are no Patient Instructions on file for this visit.   Signed, Cannon Kettle, PA-C  01/27/2023 2:01 PM     [facility]

## 2023-01-28 ENCOUNTER — Encounter (HOSPITAL_COMMUNITY): Payer: Self-pay | Admitting: Cardiology

## 2023-01-28 NOTE — Anesthesia Postprocedure Evaluation (Signed)
Anesthesia Post Note  Patient: Nicole Haynes  Procedure(s) Performed: ATRIAL FIBRILLATION ABLATION     Patient location during evaluation: PACU Anesthesia Type: General Level of consciousness: awake and alert Pain management: pain level controlled Vital Signs Assessment: post-procedure vital signs reviewed and stable Respiratory status: spontaneous breathing, nonlabored ventilation, respiratory function stable and patient connected to nasal cannula oxygen Cardiovascular status: blood pressure returned to baseline and stable Postop Assessment: no apparent nausea or vomiting Anesthetic complications: no   No notable events documented.  Last Vitals:  Vitals:   01/27/23 1800 01/27/23 1900  BP: 116/66 (!) 106/55  Pulse:  62  Resp:  16  Temp:    SpO2:  96%    Last Pain:  Vitals:   01/27/23 1530  TempSrc:   PainSc: 0-No pain                 Earl Lites P Amaurie Schreckengost

## 2023-01-29 ENCOUNTER — Ambulatory Visit: Payer: Medicare PPO | Admitting: Physician Assistant

## 2023-01-29 DIAGNOSIS — E785 Hyperlipidemia, unspecified: Secondary | ICD-10-CM

## 2023-01-29 DIAGNOSIS — I480 Paroxysmal atrial fibrillation: Secondary | ICD-10-CM

## 2023-01-30 ENCOUNTER — Encounter: Payer: Self-pay | Admitting: Cardiology

## 2023-02-10 ENCOUNTER — Other Ambulatory Visit (HOSPITAL_COMMUNITY): Payer: Self-pay | Admitting: *Deleted

## 2023-02-10 MED ORDER — DILTIAZEM HCL 30 MG PO TABS: ORAL_TABLET | ORAL | 1 refills | Status: DC

## 2023-02-21 ENCOUNTER — Ambulatory Visit (HOSPITAL_COMMUNITY)
Admission: RE | Admit: 2023-02-21 | Discharge: 2023-02-21 | Disposition: A | Discharge status: Home / Self Care | Payer: Medicare PPO | Source: Ambulatory Visit | Attending: Physician Assistant | Admitting: Physician Assistant

## 2023-02-21 VITALS — BP 136/68 | HR 54 | Ht 64.0 in | Wt 148.8 lb

## 2023-02-21 DIAGNOSIS — Z79899 Other long term (current) drug therapy: Secondary | ICD-10-CM | POA: Diagnosis not present

## 2023-02-21 DIAGNOSIS — I480 Paroxysmal atrial fibrillation: Secondary | ICD-10-CM | POA: Diagnosis not present

## 2023-02-21 DIAGNOSIS — Z7901 Long term (current) use of anticoagulants: Secondary | ICD-10-CM | POA: Insufficient documentation

## 2023-02-21 DIAGNOSIS — E038 Other specified hypothyroidism: Secondary | ICD-10-CM | POA: Insufficient documentation

## 2023-02-21 DIAGNOSIS — Z5181 Encounter for therapeutic drug level monitoring: Secondary | ICD-10-CM

## 2023-02-21 NOTE — Progress Notes (Signed)
Primary Care Physician: [name], MD Primary Cardiologist: Dr Herbie Baltimore Primary Electrophysiologist: Dr Lalla Brothers Referring Physician: Dr Abundio Miu Nicole Haynes is a 72 y.o. female with a history of hypothyroidism s/p radioactive iodine for Graves, HLD, atrial fibrillation who presents for follow up in the [facility].  The patient was previously treated with flecainide for her atrial fibrillation but this was stopped due to conduction system disease. She actually restarted it given her recurrence of symptoms associate with her atrial fibrillation. She was seen by Dr Lalla Brothers and underwent afib ablation on 01/27/23. Patient is on Eliquis for a CHADS2VASC score of 2.  On follow up today, patient reports that she has had 2-3 episodes of afib, mostly in the week following ablation. She denies swallowing pain or groin issues. She did have some chest pain which is improving.   Today, she denies symptoms of palpitations, shortness of breath, orthopnea, PND, lower extremity edema, dizziness, presyncope, syncope, snoring, daytime somnolence, bleeding, or neurologic sequela. The patient is tolerating medications without difficulties and is otherwise without complaint today.    Atrial Fibrillation Risk Factors:  she does not have symptoms or diagnosis of sleep apnea. she does not have a history of rheumatic fever.   she has a BMI of Body mass index is 25.54 kg/m.Marland Kitchen Filed Weights   02/21/23 1104  Weight: 67.5 kg    Family History  Problem Relation Age of Onset   Breast cancer Neg Hx      Atrial Fibrillation Management history:  Previous antiarrhythmic drugs: flecainide Previous cardioversions: none Previous ablations: 01/27/23 Anticoagulation history: Eliquis   Past Medical History:  Diagnosis Date   Dyslipidemia    On rosuvastatin, Krill oral   Hypothyroidism due to Graves' disease status post radiation iodide    On levothyroxine   Paroxysmal  atrial fibrillation (HCC)    Rate controlled with diltiazem, rhythm control with flecainide. CHA2DS2Vasc = 2; on Eliquis   Past Surgical History:  Procedure Laterality Date   ATRIAL FIBRILLATION ABLATION N/A 01/27/2023   Procedure: ATRIAL FIBRILLATION ABLATION;  Surgeon: [name], MD;  Location: [facility] CV [facility];  Service: Cardiovascular;  Laterality: N/A;   CARDIAC EVENT MONITOR  11/2017   30 monitor: 98% normal sinus rhythm.  2% PAF with rates as high as 160 bpm.   NM MYOVIEW LTD  12/2017   LOW RISK.  Walk for 9 minutes.  Peak heart rate 179 (116% of MPHR).  No EKG changes to suggest ischemia.  No ischemia or infarction noted on Myoview.  EF 65-70%.   None     TRANSTHORACIC ECHOCARDIOGRAM  11/28/2017   Normal LV size and function.  EF 60-65%.  Wall motion.  Mild AI, Mild-mod TR with mildly elevated PA pressures.    Current Outpatient Medications  Medication Sig Dispense Refill   acetaminophen (TYLENOL) 650 MG CR tablet Take 650 mg by mouth every 8 (eight) hours as needed for pain.     Calcium Carb-Cholecalciferol (CALCIUM 600 + D PO) Take 1 tablet by mouth 2 (two) times daily.     calcium carbonate (TUMS - DOSED IN MG ELEMENTAL CALCIUM) 500 MG chewable tablet Chew 1 tablet by mouth daily as needed for indigestion or heartburn.     Cholecalciferol (VITAMIN D) 50 MCG (2000 UT) tablet Take 2,000 Units by mouth daily.     diltiazem (CARDIZEM CD) 120 MG 24 hr capsule TAKE 1 CAPSULE BY MOUTH EVERY DAY WHEN USING FLECAINIDE AS DIRECTED (  Patient taking differently: Take 120 mg by mouth daily. TAKE 1 CAPSULE BY MOUTH EVERY DAY WHEN USING FLECAINIDE AS DIRECTED) 90 capsule 2   diltiazem (CARDIZEM) 30 MG tablet Take 1 tablet every 4 hours AS NEEDED for AFIB heart rate >100 as long as top BP >100. 30 tablet 1   ELIQUIS 5 MG TABS tablet TAKE 1 TABLET BY MOUTH TWICE A DAY 180 tablet 1   fexofenadine (ALLEGRA) 180 MG tablet Take 180 mg by mouth daily as needed for allergies or rhinitis.      flecainide (TAMBOCOR) 50 MG tablet IF YOU DEVELOP AFIB - FLECAINIDE TAKE 200 MG DOSE AT THAT TIME THEN ABOUT 12 HOURS LATER START TAKING 100 MG TWICE A DAY UNTIL RHYTHM BREAKS . ONCE IT BREAKS TAKE 50 MG TWICE A DAY FOR 2 WEEKS THEN STOP UNTIL NEXT EPISODE. AS NEEDED (Patient taking differently: Take 25 mg by mouth 2 (two) times daily. IF YOU DEVELOP AFIB - FLECAINIDE TAKE 200 MG DOSE AT THAT TIME THEN ABOUT 12 HOURS LATER START TAKING 100 MG TWICE A DAY UNTIL RHYTHM BREAKS . ONCE IT BREAKS TAKE 50 MG TWICE A DAY FOR 2 WEEKS THEN STOP UNTIL NEXT EPISODE. AS NEEDED) 180 tablet 3   levothyroxine (SYNTHROID) 88 MCG tablet TAKE 1 CAPSULE BY MOUTH EVERY DAY IN THE MORNING ON EMPTY STOMACH     MAGNESIUM PO Take 1 tablet by mouth daily.     Menthol, Topical Analgesic, (BIOFREEZE EX) Apply 1 Application topically daily as needed (pain).     Misc Natural Products (JOINT HEALTH) CAPS Take 1 tablet by mouth daily.     Multiple Vitamin (MULTIVITAMIN WITH MINERALS) TABS tablet Take 1 tablet by mouth daily.     pantoprazole (PROTONIX) 40 MG tablet Take 1 tablet (40 mg total) by mouth daily. 45 tablet 0   Probiotic Product (PROBIOTIC PO) Take 1 capsule by mouth daily.     rosuvastatin (CRESTOR) 5 MG tablet Take 5 mg by mouth daily.  1   tretinoin (RETIN-A) 0.025 % cream Apply 1 Application topically at bedtime.     No current facility-administered medications for this encounter.    Allergies  Allergen Reactions   Penicillins Swelling and Rash    Social History   Socioeconomic History   Marital status: Married    Spouse name: Not on file   Number of children: 2   Years of education: Not on file   Highest education level: Not on file  Occupational History   Occupation: Runner, broadcasting/film/video    Comment: Retired  Tobacco Use   Smoking status: Former   Smokeless tobacco: Never  Building services engineer Use: Never used  Substance and Sexual Activity   Alcohol use: Yes    Comment: wine   Drug use: No   Sexual  activity: Not Currently  Other Topics Concern   Not on file  Social History Narrative   She is a married mother of 2.   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   She exercises about 3 days a week walking about 10 miles a week total.  She also enjoys yoga and routine hiking.  (Dedicated walking 60 minutes 2 days a week)   1-2 glasses of alcohol/wine per week.   Caffeine 2-1/2 cups daily.   Non-smoker but history of exposure to passive smoke.   Social Determinants of Health   Financial Resource Strain: Not on file  Food Insecurity: Not on file  Transportation Needs: Not on  file  Physical Activity: Not on file  Stress: Not on file  Social Connections: Not on file  Intimate Partner Violence: Not on file     ROS- All systems are reviewed and negative except as per the HPI above.  Physical Exam: Vitals:   02/21/23 1104  BP: 136/68  Pulse: (!) 54  Weight: 67.5 kg  Height: 5\' 4"  (1.626 m)    GEN- The patient is a well appearing female, alert and oriented x 3 today.   Head- normocephalic, atraumatic Eyes-  Sclera clear, conjunctiva pink Ears- hearing intact Oropharynx- clear Neck- supple  Lungs- Clear to ausculation bilaterally, normal work of breathing Heart- Regular rate and rhythm, no murmurs, rubs or gallops  GI- soft, NT, ND, + BS Extremities- no clubbing, cyanosis, or edema MS- no significant deformity or atrophy Skin- no rash or lesion Psych- euthymic mood, full affect Neuro- strength and sensation are intact  Wt Readings from Last 3 Encounters:  02/21/23 67.5 kg  01/27/23 66.2 kg  09/20/22 67.1 kg    EKG today demonstrates  SB Vent. rate 54 BPM PR interval 154 ms QRS duration 80 ms QT/QTcB 476/451 ms  Echo 08/12/22 demonstrated 1. Left ventricular ejection fraction, by estimation, is 65 to 70%. Left  ventricular ejection fraction by 3D volume is 68 %. The left ventricle has  normal function. The left ventricle has no regional wall motion   abnormalities. Left ventricular diastolic parameters are consistent with Grade I diastolic dysfunction (impaired relaxation). The average left ventricular global longitudinal strain is -27.7 %.   2. Right ventricular systolic function is normal. The right ventricular  size is normal. There is normal pulmonary artery systolic pressure. The  estimated right ventricular systolic pressure is 21.0 mmHg.   3. The mitral valve is normal in structure. Trivial mitral valve  regurgitation. No evidence of mitral stenosis.   4. The aortic valve was not well visualized. Aortic valve regurgitation  is mild. No aortic stenosis is present.   5. The inferior vena cava is normal in size with greater than 50%  respiratory variability, suggesting right atrial pressure of 3 mmHg.   Comparison(s): 11/28/17 EF 60-65%. PA pressure .   Epic records are reviewed at length today.  CHA2DS2-VASc Score = 2  The patient's score is based upon: CHF History: 0 HTN History: 0 Diabetes History: 0 Stroke History: 0 Vascular Disease History: 0 Age Score: 1 Gender Score: 1       ASSESSMENT AND PLAN: 1. Paroxysmal Atrial Fibrillation (ICD10:  I48.0) The patient's CHA2DS2-VASc score is 2, indicating a 2.2% annual risk of stroke.   S/p afib ablation 01/27/23 Patient appears to be maintaining SR with some breakthrough episodes early after ablation.  Continue flecainide 50 mg BID and diltiazem 120 mg daily for now. Hopefully, these medications will be short term post ablation.  Continue diltiazem 30 mg q 4 hours PRN for heart racing. Continue Eliquis 5 mg BID with no missed doses for 3 months post ablation.     Follow up with Dr Lalla Brothers as scheduled.    Jorja Loa PA-C [facility] [address] [place], [place] [address] [phone] 02/21/2023 1:28 PM

## 2023-03-06 ENCOUNTER — Other Ambulatory Visit: Payer: Self-pay | Admitting: Cardiology

## 2023-03-06 ENCOUNTER — Other Ambulatory Visit (HOSPITAL_COMMUNITY): Payer: Self-pay | Admitting: Physician Assistant

## 2023-03-10 ENCOUNTER — Other Ambulatory Visit: Payer: Self-pay | Admitting: *Deleted

## 2023-03-14 ENCOUNTER — Other Ambulatory Visit (HOSPITAL_COMMUNITY): Payer: Self-pay | Admitting: Physician Assistant

## 2023-03-20 ENCOUNTER — Ambulatory Visit: Payer: Medicare PPO | Admitting: Cardiology

## 2023-04-24 ENCOUNTER — Other Ambulatory Visit: Payer: Self-pay | Admitting: Cardiology

## 2023-04-30 ENCOUNTER — Ambulatory Visit: Payer: Medicare PPO | Admitting: Cardiology

## 2023-05-08 ENCOUNTER — Ambulatory Visit: Payer: Medicare PPO | Admitting: Cardiology

## 2023-05-27 NOTE — Progress Notes (Unsigned)
  Electrophysiology Office Note:   Date:  05/28/2023  ID:  Nicole Haynes, DOB 07-Mar-1951, MRN [id number]  Primary Cardiologist: [name], MD Electrophysiologist: [name], MD      History of Present Illness:   Nicole Haynes is a 72 y.o. female with h/o hypothyroidisim treated with radioactive iodine for Graves, HLD, and AF seen today for routine electrophysiology followup.   Pt is s/p AF ablation 01/27/2023  Since last being seen in our clinic the patient reports doing very well. No breakthrough AF. She didn't feel comfortable stopping flecainide so has remained on it. she denies chest pain, palpitations, dyspnea, PND, orthopnea, nausea, vomiting, dizziness, syncope, edema, weight gain, or early satiety.   Review of systems complete and found to be negative unless listed in HPI.   EP Information / Studies Reviewed:    EKG is ordered today. Personal review as below.  EKG Interpretation Date/Time:  Wednesday May 28 2023 09:07:07 EDT Ventricular Rate:  60 PR Interval:  158 QRS Duration:  82 QT Interval:  440 QTC Calculation: 440 R Axis:   -28  Text Interpretation: Normal sinus rhythm Low voltage QRS When compared with ECG of 21-Feb-2023 11:26, No significant change was found Confirmed by Maxine Glenn [phone]) on 05/28/2023 9:17:21 AM    Physical Exam:   VS:  BP 132/74   Pulse 60   Ht 5\' 4"  (1.626 m)   Wt 146 lb (66.2 kg)   SpO2 96%   BMI 25.06 kg/m    Wt Readings from Last 3 Encounters:  05/28/23 146 lb (66.2 kg)  02/21/23 148 lb 12.8 oz (67.5 kg)  01/27/23 146 lb (66.2 kg)     GEN: Well nourished, well developed in no acute distress NECK: No JVD; No carotid bruits CARDIAC: Regular rate and rhythm, no murmurs, rubs, gallops RESPIRATORY:  Clear to auscultation without rales, wheezing or rhonchi  ABDOMEN: Soft, non-tender, non-distended EXTREMITIES:  No edema; No deformity   ASSESSMENT AND PLAN:    PAF EKG today shows NSR S/p ablation  01/27/2023 Will stop flecainide and daily diltiazem at this time. To be resumed if she has further AF.  Continue eliquis 5 mg BID for CHA2DS2/VASc of 2 Continue prn diltiazem.    Follow up with Dr. Lalla Brothers in 6 months  Signed, Graciella Freer, PA-C

## 2023-05-28 ENCOUNTER — Encounter: Payer: Self-pay | Admitting: Student

## 2023-05-28 ENCOUNTER — Ambulatory Visit: Payer: Medicare PPO | Attending: Cardiology | Admitting: Student

## 2023-05-28 VITALS — BP 132/74 | HR 60 | Ht 64.0 in | Wt 146.0 lb

## 2023-05-28 DIAGNOSIS — I480 Paroxysmal atrial fibrillation: Secondary | ICD-10-CM | POA: Diagnosis not present

## 2023-05-28 NOTE — Patient Instructions (Signed)
Medication Instructions:  Your physician has recommended you make the following change in your medication:  1.Stop flecainide 50 mg 2.Stop diltiazem 120 mg *If you need a refill on your cardiac medications before your next appointment, please call your pharmacy*  Lab Work: None ordered If you have labs (blood work) drawn today and your tests are completely normal, you will receive your results only by: MyChart Message (if you have MyChart) OR A paper copy in the mail If you have any lab test that is abnormal or we need to change your treatment, we will call you to review the results.  Follow-Up: At [facility], you and your health needs are our priority.  As part of our continuing mission to provide you with exceptional heart care, we have created designated Provider Care Teams.  These Care Teams include your primary Cardiologist (physician) and Advanced Practice Providers (APPs -  Physician Assistants and Nurse Practitioners) who all work together to provide you with the care you need, when you need it.  Your next appointment:   6 month(s)  Provider:   Steffanie Dunn, MD

## 2023-06-02 ENCOUNTER — Other Ambulatory Visit: Payer: Self-pay | Admitting: Cardiology

## 2023-06-05 DIAGNOSIS — H25813 Combined forms of age-related cataract, bilateral: Secondary | ICD-10-CM | POA: Diagnosis not present

## 2023-06-05 DIAGNOSIS — H02831 Dermatochalasis of right upper eyelid: Secondary | ICD-10-CM | POA: Diagnosis not present

## 2023-06-05 DIAGNOSIS — H526 Other disorders of refraction: Secondary | ICD-10-CM | POA: Diagnosis not present

## 2023-06-05 DIAGNOSIS — H04123 Dry eye syndrome of bilateral lacrimal glands: Secondary | ICD-10-CM | POA: Diagnosis not present

## 2023-06-05 DIAGNOSIS — H02403 Unspecified ptosis of bilateral eyelids: Secondary | ICD-10-CM | POA: Diagnosis not present

## 2023-06-05 DIAGNOSIS — H02834 Dermatochalasis of left upper eyelid: Secondary | ICD-10-CM | POA: Diagnosis not present

## 2023-06-09 DIAGNOSIS — H15122 Nodular episcleritis, left eye: Secondary | ICD-10-CM | POA: Diagnosis not present

## 2023-06-09 DIAGNOSIS — H1132 Conjunctival hemorrhage, left eye: Secondary | ICD-10-CM | POA: Diagnosis not present

## 2023-06-12 ENCOUNTER — Encounter: Payer: Self-pay | Admitting: Cardiology

## 2023-07-09 ENCOUNTER — Other Ambulatory Visit: Payer: Self-pay | Admitting: Cardiology

## 2023-07-09 NOTE — Telephone Encounter (Signed)
Prescription refill request for Eliquis received. Indication: afib  Last office visit: [place], 05/28/2023 Scr: 1.20, 01/13/2023 Age: 72 yo  Weight: 66.2 kg   Refill sent.

## 2023-07-11 DIAGNOSIS — M722 Plantar fascial fibromatosis: Secondary | ICD-10-CM | POA: Diagnosis not present

## 2023-07-28 DIAGNOSIS — H02831 Dermatochalasis of right upper eyelid: Secondary | ICD-10-CM | POA: Diagnosis not present

## 2023-07-28 DIAGNOSIS — Z01818 Encounter for other preprocedural examination: Secondary | ICD-10-CM | POA: Diagnosis not present

## 2023-07-28 DIAGNOSIS — H0279 Other degenerative disorders of eyelid and periocular area: Secondary | ICD-10-CM | POA: Diagnosis not present

## 2023-07-28 DIAGNOSIS — H57813 Brow ptosis, bilateral: Secondary | ICD-10-CM | POA: Diagnosis not present

## 2023-07-28 DIAGNOSIS — H02413 Mechanical ptosis of bilateral eyelids: Secondary | ICD-10-CM | POA: Diagnosis not present

## 2023-07-28 DIAGNOSIS — H53483 Generalized contraction of visual field, bilateral: Secondary | ICD-10-CM | POA: Diagnosis not present

## 2023-07-28 DIAGNOSIS — H02411 Mechanical ptosis of right eyelid: Secondary | ICD-10-CM | POA: Diagnosis not present

## 2023-07-28 DIAGNOSIS — H02412 Mechanical ptosis of left eyelid: Secondary | ICD-10-CM | POA: Diagnosis not present

## 2023-07-28 DIAGNOSIS — H02834 Dermatochalasis of left upper eyelid: Secondary | ICD-10-CM | POA: Diagnosis not present

## 2023-07-30 DIAGNOSIS — R1030 Lower abdominal pain, unspecified: Secondary | ICD-10-CM | POA: Diagnosis not present

## 2023-07-30 DIAGNOSIS — R350 Frequency of micturition: Secondary | ICD-10-CM | POA: Diagnosis not present

## 2023-08-04 ENCOUNTER — Emergency Department (HOSPITAL_BASED_OUTPATIENT_CLINIC_OR_DEPARTMENT_OTHER): Payer: Medicare PPO

## 2023-08-04 ENCOUNTER — Emergency Department (HOSPITAL_BASED_OUTPATIENT_CLINIC_OR_DEPARTMENT_OTHER)
Admission: EM | Admit: 2023-08-04 | Discharge: 2023-08-04 | Disposition: A | Discharge status: Home / Self Care | Payer: Medicare PPO | Attending: Emergency Medicine | Admitting: Emergency Medicine

## 2023-08-04 ENCOUNTER — Other Ambulatory Visit: Payer: Self-pay

## 2023-08-04 ENCOUNTER — Encounter (HOSPITAL_BASED_OUTPATIENT_CLINIC_OR_DEPARTMENT_OTHER): Payer: Self-pay

## 2023-08-04 ENCOUNTER — Other Ambulatory Visit (HOSPITAL_BASED_OUTPATIENT_CLINIC_OR_DEPARTMENT_OTHER): Payer: Self-pay

## 2023-08-04 DIAGNOSIS — R1032 Left lower quadrant pain: Secondary | ICD-10-CM

## 2023-08-04 DIAGNOSIS — R350 Frequency of micturition: Secondary | ICD-10-CM | POA: Diagnosis not present

## 2023-08-04 DIAGNOSIS — Z7901 Long term (current) use of anticoagulants: Secondary | ICD-10-CM | POA: Insufficient documentation

## 2023-08-04 DIAGNOSIS — R399 Unspecified symptoms and signs involving the genitourinary system: Secondary | ICD-10-CM | POA: Diagnosis not present

## 2023-08-04 LAB — CBC WITH DIFFERENTIAL/PLATELET
Abs Immature Granulocytes: 0.01 10*3/uL (ref 0.00–0.07)
Basophils Absolute: 0.1 10*3/uL (ref 0.0–0.1)
Basophils Relative: 1 %
Eosinophils Absolute: 0.3 10*3/uL (ref 0.0–0.5)
Eosinophils Relative: 5 %
HCT: 43.9 % (ref 36.0–46.0)
Hemoglobin: 14.4 g/dL (ref 12.0–15.0)
Immature Granulocytes: 0 %
Lymphocytes Relative: 31 %
Lymphs Abs: 2.2 10*3/uL (ref 0.7–4.0)
MCH: 29.2 pg (ref 26.0–34.0)
MCHC: 32.8 g/dL (ref 30.0–36.0)
MCV: 89 fL (ref 80.0–100.0)
Monocytes Absolute: 0.5 10*3/uL (ref 0.1–1.0)
Monocytes Relative: 7 %
Neutro Abs: 4.1 10*3/uL (ref 1.7–7.7)
Neutrophils Relative %: 56 %
Platelets: 262 10*3/uL (ref 150–400)
RBC: 4.93 MIL/uL (ref 3.87–5.11)
RDW: 13.2 % (ref 11.5–15.5)
WBC: 7.2 10*3/uL (ref 4.0–10.5)
nRBC: 0 % (ref 0.0–0.2)

## 2023-08-04 LAB — BASIC METABOLIC PANEL
Anion gap: 10 (ref 5–15)
BUN: 12 mg/dL (ref 8–23)
CO2: 29 mmol/L (ref 22–32)
Calcium: 9.8 mg/dL (ref 8.9–10.3)
Chloride: 102 mmol/L (ref 98–111)
Creatinine, Ser: 1.08 mg/dL — ABNORMAL HIGH (ref 0.44–1.00)
GFR, Estimated: 55 mL/min — ABNORMAL LOW (ref >60)
Glucose, Bld: 87 mg/dL (ref 70–99)
Potassium: 4.3 mmol/L (ref 3.5–5.1)
Sodium: 141 mmol/L (ref 135–145)

## 2023-08-04 LAB — URINALYSIS, ROUTINE W REFLEX MICROSCOPIC
Bilirubin Urine: NEGATIVE
Glucose, UA: NEGATIVE mg/dL
Hgb urine dipstick: NEGATIVE
Ketones, ur: NEGATIVE mg/dL
Leukocytes,Ua: NEGATIVE
Nitrite: NEGATIVE
Protein, ur: NEGATIVE mg/dL
Specific Gravity, Urine: 1.009 (ref 1.005–1.030)
pH: 6 (ref 5.0–8.0)

## 2023-08-04 MED ORDER — IOHEXOL 300 MG/ML  SOLN
100.0000 mL | Freq: Once | INTRAMUSCULAR | Status: AC | PRN
  Administered 2023-08-04: 80 mL via INTRAVENOUS

## 2023-08-04 NOTE — Discharge Instructions (Signed)
Please read and follow all provided instructions.  Your diagnoses today include:  1. LLQ abdominal pain     Tests performed today include: Blood cell counts and platelets: were normal Kidney and liver function tests Pancreas function test (called lipase) Urine test to look for infection: normal appearing urine CT abdomen and pelvis: results pending, I will call you with any results that need antibiotics or other treatments Vital signs. See below for your results today.   Medications prescribed:  None  Take any prescribed medications only as directed.  Home care instructions:  Follow any educational materials contained in this packet.  Follow-up instructions: Please follow-up with your primary care provider in the next 3 days for further evaluation of your symptoms.    Return instructions:  SEEK IMMEDIATE MEDICAL ATTENTION IF: The pain does not go away or becomes severe  A temperature above 101F develops  Repeated vomiting occurs (multiple episodes)  The pain becomes localized to portions of the abdomen. The right side could possibly be appendicitis. In an adult, the left lower portion of the abdomen could be colitis or diverticulitis.  Blood is being passed in stools or vomit (bright red or black tarry stools)  You develop chest pain, difficulty breathing, dizziness or fainting, or become confused, poorly responsive, or inconsolable (young children) If you have any other emergent concerns regarding your health  Additional Information: Abdominal (belly) pain can be caused by many things. Your caregiver performed an examination and possibly ordered blood/urine tests and imaging (CT scan, x-rays, ultrasound). Many cases can be observed and treated at home after initial evaluation in the emergency department. Even though you are being discharged home, abdominal pain can be unpredictable. Therefore, you need a repeated exam if your pain does not resolve, returns, or worsens. Most  patients with abdominal pain don't have to be admitted to the hospital or have surgery, but serious problems like appendicitis and gallbladder attacks can start out as nonspecific pain. Many abdominal conditions cannot be diagnosed in one visit, so follow-up evaluations are very important.  Your vital signs today were: BP (!) 151/77   Pulse 60   Temp 98.3 F (36.8 C) (Oral)   Resp 18   Ht 5\' 4"  (1.626 m)   Wt 66.2 kg   SpO2 98%   BMI 25.06 kg/m  If your blood pressure (bp) was elevated above 135/85 this visit, please have this repeated by your doctor within one month. --------------

## 2023-08-04 NOTE — ED Provider Notes (Signed)
[place] EMERGENCY DEPARTMENT AT [facility] Provider Note   CSN: [id number] Arrival date & time: 08/04/23  1215     History  Chief Complaint  Patient presents with   Urinary Frequency    Nicole Haynes is a 72 y.o. female.  Patient with history of atrial fibrillation presents to the emergency department today for evaluation of left lower quadrant abdominal pain as well as irritative UTI symptoms including increased frequency and dysuria.  Symptoms started about a week ago.  Patient was out of town and went to an urgent care.  She had a urine dipstick which was "inconclusive".  A culture was sent but apparently this was negative.  She was trialed on Macrobid which she has been taking.  Symptoms have persisted.  She has a fullness and tenderness in the left lower quadrant.  No diarrhea or blood in the stool.  No fevers or vomiting.  No history of diverticulitis.  Does not remember that she has had diverticulosis, but has had colonoscopies.  Was seen at [facility] walk-in clinic prior to arrival, they encouraged her to be checked here.       Home Medications Prior to Admission medications   Medication Sig Start Date End Date Taking? Authorizing Provider  acetaminophen (TYLENOL) 650 MG CR tablet Take 650 mg by mouth every 8 (eight) hours as needed for pain.    [provider]  Calcium Carb-Cholecalciferol (CALCIUM 600 + D PO) Take 1 tablet by mouth 2 (two) times daily.    [provider]  calcium carbonate (TUMS - DOSED IN MG ELEMENTAL CALCIUM) 500 MG chewable tablet Chew 1 tablet by mouth daily as needed for indigestion or heartburn.    [provider]  Cholecalciferol (VITAMIN D) 50 MCG (2000 UT) tablet Take 2,000 Units by mouth daily.    [provider]  diltiazem (CARDIZEM) 30 MG tablet TAKE 1 TABLET EVERY 4 HOURS AS NEEDED FOR AFIB HEART RATE >100 AS LONG AS TOP BP >100. 03/06/23   Fenton, Clint R, PA  ELIQUIS 5 MG TABS tablet TAKE 1  TABLET BY MOUTH TWICE A DAY 07/09/23   Marykay Lex, MD  fexofenadine (ALLEGRA) 180 MG tablet Take 180 mg by mouth daily as needed for allergies or rhinitis.    [provider]  levothyroxine (SYNTHROID) 88 MCG tablet TAKE 1 CAPSULE BY MOUTH EVERY DAY IN THE MORNING ON EMPTY STOMACH 01/14/19   [provider]  MAGNESIUM PO Take 1 tablet by mouth daily.    [provider]  Menthol, Topical Analgesic, (BIOFREEZE EX) Apply 1 Application topically daily as needed (pain).    [provider]  Misc Natural Products (JOINT HEALTH) CAPS Take 1 tablet by mouth daily.    [provider]  Multiple Vitamin (MULTIVITAMIN WITH MINERALS) TABS tablet Take 1 tablet by mouth daily.    [provider]  pantoprazole (PROTONIX) 40 MG tablet Take 1 tablet (40 mg total) by mouth daily. 01/27/23 03/13/23  [name] E, PA-C  Probiotic Product (PROBIOTIC PO) Take 1 capsule by mouth daily.    [provider]  rosuvastatin (CRESTOR) 5 MG tablet Take 5 mg by mouth daily. 10/11/17   [provider]  tretinoin (RETIN-A) 0.025 % cream Apply 1 Application topically at bedtime. 12/03/22   [provider]      Allergies    Penicillins    Review of Systems   Review of Systems  Physical Exam Updated Vital Signs BP 138/73 (BP Location:  Right Arm)   Pulse 60   Temp 98.3 F (36.8 C) (Oral)   Resp 16   Ht 5\' 4"  (1.626 m)   Wt 66.2 kg   SpO2 99%   BMI 25.06 kg/m  Physical Exam Vitals and nursing note reviewed.  Constitutional:      General: She is not in acute distress.    Appearance: She is well-developed.  HENT:     Head: Normocephalic and atraumatic.     Right Ear: External ear normal.     Left Ear: External ear normal.     Nose: Nose normal.  Eyes:     Conjunctiva/sclera: Conjunctivae normal.  Cardiovascular:     Rate and Rhythm: Normal rate and regular rhythm.     Heart sounds: No murmur heard. Pulmonary:     Effort: No  respiratory distress.     Breath sounds: No wheezing, rhonchi or rales.  Abdominal:     Palpations: Abdomen is soft.     Tenderness: There is abdominal tenderness. There is no guarding or rebound.     Comments: Mild left lower and suprapubic tenderness without rebound or guarding.  Musculoskeletal:     Cervical back: Normal range of motion and neck supple.     Right lower leg: No edema.     Left lower leg: No edema.  Skin:    General: Skin is warm and dry.     Findings: No rash.  Neurological:     General: No focal deficit present.     Mental Status: She is alert. Mental status is at baseline.     Motor: No weakness.  Psychiatric:        Mood and Affect: Mood normal.     ED Results / Procedures / Treatments   Labs (all labs ordered are listed, but only abnormal results are displayed) Labs Reviewed  BASIC METABOLIC PANEL - Abnormal; Notable for the following components:      Result Value   Creatinine, Ser 1.08 (*)    GFR, Estimated 55 (*)    All other components within normal limits  URINALYSIS, ROUTINE W REFLEX MICROSCOPIC  CBC WITH DIFFERENTIAL/PLATELET    EKG None  Radiology CT ABDOMEN PELVIS W CONTRAST  Result Date: 08/04/2023 CLINICAL DATA:  LEFT lower quadrant abdominal pain EXAM: CT ABDOMEN AND PELVIS WITH CONTRAST TECHNIQUE: Multidetector CT imaging of the abdomen and pelvis was performed using the standard protocol following bolus administration of intravenous contrast. RADIATION DOSE REDUCTION: This exam was performed according to the departmental dose-optimization program which includes automated exposure control, adjustment of the mA and/or kV according to patient size and/or use of iterative reconstruction technique. CONTRAST:  80mL OMNIPAQUE IOHEXOL 300 MG/ML  SOLN COMPARISON:  None Available. FINDINGS: Lower chest: Lung bases are clear. Hepatobiliary: No focal hepatic lesion. Normal gallbladder. No biliary duct dilatation. Common bile duct is normal. Pancreas:  Pancreas is normal. No ductal dilatation. No pancreatic inflammation. Spleen: Normal spleen Adrenals/urinary tract: Adrenal glands and kidneys are normal. The ureters and bladder normal. Stomach/Bowel: Stomach, small bowel, appendix, and cecum are normal. The colon and rectosigmoid colon are normal. Vascular/Lymphatic: Abdominal aorta is normal caliber. No periportal or retroperitoneal adenopathy. No pelvic adenopathy. Reproductive: Uterus and adnexa unremarkable. Other: No inguinal hernia or ventral hernia. Musculoskeletal: No aggressive osseous lesion. IMPRESSION: 1. No acute findings in the abdomen pelvis. 2. Normal bowel. 3. No inguinal hernia or ventral hernia. Electronically Signed   By: Genevive Bi M.D.   On: 08/04/2023 18:22  Procedures Procedures    Medications Ordered in ED Medications - No data to display  ED Course/ Medical Decision Making/ A&P    Patient seen and examined. History obtained directly from patient.   Labs/EKG: Ordered CBC and CMP  Imaging: Ordered CT abdomen pelvis  Medications/Fluids: None ordered  Most recent vital signs reviewed and are as follows: BP 138/73 (BP Location: Right Arm)   Pulse 60   Temp 98.3 F (36.8 C) (Oral)   Resp 16   Ht 5\' 4"  (1.626 m)   Wt 66.2 kg   SpO2 99%   BMI 25.06 kg/m   Initial impression: Patient looks very well.  She does not have a UTI.  She does have left lower quadrant pain and I suspect that she may have diverticulitis which is impinging upon the bladder, causing her symptoms.  Low concern for sepsis.  Discussed empiric treatment versus further workup today with labs and imaging.  Patient agrees to proceed with further workup.  6:33 PM Reassessment performed earlier prior to discharge. Patient appears comfortable.  Sitting in chair at bedside.  Labs personally reviewed and interpreted including: CBC unremarkable; BMP slightly elevated creatinine at 1.08 otherwise unremarkable; UA without signs of infection or  bleeding.  Imaging personally visualized and interpreted including: CT scan of the abdomen and pelvis, pending read, no acute findings noted.  Reviewed pertinent lab work and imaging with patient at bedside. Questions answered.   Most current vital signs reviewed and are as follows: BP (!) 151/77   Pulse 60   Temp 98.3 F (36.8 C) (Oral)   Resp 18   Ht 5\' 4"  (1.626 m)   Wt 66.2 kg   SpO2 98%   BMI 25.06 kg/m   Plan: There has been a long delay in obtaining CT imaging reads.  Discussed discharge now and I will call with any positive results versus waiting for imaging to result.  Patient would like to be discharged at this time.     Prescriptions written for: None  Other home care instructions discussed: Parke Simmers diet  ED return instructions discussed: The patient was urged to return to the Emergency Department immediately with worsening of current symptoms, worsening abdominal pain, persistent vomiting, blood noted in stools, fever, or any other concerns. The patient verbalized understanding.   Follow-up instructions discussed: Patient encouraged to follow-up with their PCP in 3 days.    Reviewed CT imaging results, CT is negative without signs of colitis or diverticulitis, or other acute findings.  No acute changes to plan.                                 Medical Decision Making Amount and/or Complexity of Data Reviewed Labs: ordered. Radiology: ordered.  Risk Prescription drug management.   For this patient's complaint of abdominal pain, the following conditions were considered on the differential diagnosis: gastritis/PUD, enteritis/duodenitis, appendicitis, cholelithiasis/cholecystitis, cholangitis, pancreatitis, ruptured viscus, colitis, diverticulitis, small/large bowel obstruction, proctitis, cystitis, pyelonephritis, ureteral colic, aortic dissection, aortic aneurysm. In women, ectopic pregnancy, pelvic inflammatory disease, ovarian cysts, and tubo-ovarian abscess were  also considered. Atypical chest etiologies were also considered including ACS, PE, and pneumonia.  Given ongoing left lower quadrant pain and urinary symptoms with negative UA, concern for diverticulitis or colitis.  Fortunately CT scan did not bear this out.  It did not show another cause.  Patient will need to follow-up with PCP.  The patient's vital signs, pertinent lab  work and imaging were reviewed and interpreted as discussed in the ED course. Hospitalization was considered for further testing, treatments, or serial exams/observation. However as patient is well-appearing, has a stable exam, and reassuring studies today, I do not feel that they warrant admission at this time. This plan was discussed with the patient who verbalizes agreement and comfort with this plan and seems reliable and able to return to the Emergency Department with worsening or changing symptoms.          Final Clinical Impression(s) / ED Diagnoses Final diagnoses:  LLQ abdominal pain    Rx / DC Orders ED Discharge Orders     None         Renne Crigler, PA-C 08/04/23 1835    Ernie Avena, MD 08/05/23 1621

## 2023-08-04 NOTE — ED Triage Notes (Signed)
Onset over a week.  Being treated for UTI.  States still have frequency and left flank pain.

## 2023-08-08 DIAGNOSIS — N1832 Chronic kidney disease, stage 3b: Secondary | ICD-10-CM | POA: Diagnosis not present

## 2023-08-08 DIAGNOSIS — R1032 Left lower quadrant pain: Secondary | ICD-10-CM | POA: Diagnosis not present

## 2023-08-08 DIAGNOSIS — Z23 Encounter for immunization: Secondary | ICD-10-CM | POA: Diagnosis not present

## 2023-08-08 DIAGNOSIS — F419 Anxiety disorder, unspecified: Secondary | ICD-10-CM | POA: Diagnosis not present

## 2023-08-25 ENCOUNTER — Telehealth: Payer: Self-pay | Admitting: *Deleted

## 2023-08-25 DIAGNOSIS — H53483 Generalized contraction of visual field, bilateral: Secondary | ICD-10-CM | POA: Diagnosis not present

## 2023-08-25 NOTE — Telephone Encounter (Signed)
   Pre-operative Risk Assessment    Patient Name: Nicole Haynes  DOB: 03-05-1951 MRN: [id number]  DATE OF LAST VISIT: 05/28/23 Otilio Saber, PAC DATE OF NEXT VISIT: 12/31/23 Steffanie Dunn AND 01/14/24 Juanda Crumble, PAC    Request for Surgical Clearance    Procedure:   B/L UPPER EYELID BLEPHAROPLASTY AND TRICHOPHYTIC BROW LIFT  Date of Surgery:  Clearance TBD                                 Surgeon:  DR. Shawna Orleans Surgeon's Group or Practice Name:  [facility]  Phone number:  [phone] Fax number:  [phone]   Type of Clearance Requested:   - Medical  - Pharmacy:  Hold Apixaban (Eliquis)     Type of Anesthesia:  MAC   Additional requests/questions:    [name]   08/25/2023, 1:29 PM

## 2023-08-26 NOTE — Telephone Encounter (Signed)
Please advise holding Eliquis prior to B/L upper eyelid blepharoplasty and trichophytic brow lift.   Thank you!  DW

## 2023-08-28 NOTE — Telephone Encounter (Signed)
   Name: Nicole Haynes  DOB: [birth year]  MRN: [id number]  Primary Cardiologist: [name], MD   Preoperative team, please contact this patient and set up a phone call appointment for further preoperative risk assessment. Please obtain consent and complete medication review. Thank you for your help.  I confirm that guidance regarding antiplatelet and oral anticoagulation therapy has been completed and, if necessary, noted below.  Per office protocol, patient can hold Eliquis for 2 days prior to procedure.   I also confirmed the patient resides in the state of [place]. As per [facility] Medical Board telemedicine laws, the patient must reside in the state in which the provider is licensed.   Napoleon Form, Leodis Rains, NP 08/28/2023, 8:35 AM [facility]

## 2023-08-28 NOTE — Telephone Encounter (Signed)
Patient with diagnosis of afib on Eliquis for anticoagulation.    Procedure:  B/L UPPER EYELID BLEPHAROPLASTY AND TRICHOPHYTIC BROW LIFT  Date of procedure: TBD   CHA2DS2-VASc Score = 2   This indicates a 2.2% annual risk of stroke. The patient's score is based upon: CHF History: 0 HTN History: 0 Diabetes History: 0 Stroke History: 0 Vascular Disease History: 0 Age Score: 1 Gender Score: 1       CrCl 49 ml/min  Per office protocol, patient can hold Eliquis for 2 days prior to procedure.    **This guidance is not considered finalized until pre-operative APP has relayed final recommendations.**

## 2023-08-28 NOTE — Telephone Encounter (Signed)
1st attempt to reach pt to schedule tele visit. Lvm  

## 2023-09-01 NOTE — Telephone Encounter (Signed)
2nd attempt to reach the pt to set up tele pre op appt.  

## 2023-09-02 ENCOUNTER — Telehealth: Payer: Self-pay | Admitting: Cardiology

## 2023-09-02 ENCOUNTER — Telehealth: Payer: Self-pay | Admitting: *Deleted

## 2023-09-02 NOTE — Telephone Encounter (Signed)
Patient is returning phone call to set up a tele visit appt.

## 2023-09-02 NOTE — Telephone Encounter (Signed)
Pt has been scheduled tele pre op appt 09/16/23. Procedure won't be scheduled until the pt has been cleared. Med rec and consent are done.     Patient Consent for Virtual Visit        Nicole Haynes has provided verbal consent on 09/02/2023 for a virtual visit (video or telephone).   CONSENT FOR VIRTUAL VISIT FOR:  Nicole Haynes  By participating in this virtual visit I agree to the following:  I hereby voluntarily request, consent and authorize [facility] and its employed or contracted physicians, physician assistants, nurse practitioners or other licensed health care professionals (the Practitioner), to provide me with telemedicine health care services (the "Services") as deemed necessary by the treating Practitioner. I acknowledge and consent to receive the Services by the Practitioner via telemedicine. I understand that the telemedicine visit will involve communicating with the Practitioner through live audiovisual communication technology and the disclosure of certain medical information by electronic transmission. I acknowledge that I have been given the opportunity to request an in-person assessment or other available alternative prior to the telemedicine visit and am voluntarily participating in the telemedicine visit.  I understand that I have the right to withhold or withdraw my consent to the use of telemedicine in the course of my care at any time, without affecting my right to future care or treatment, and that the Practitioner or I may terminate the telemedicine visit at any time. I understand that I have the right to inspect all information obtained and/or recorded in the course of the telemedicine visit and may receive copies of available information for a reasonable fee.  I understand that some of the potential risks of receiving the Services via telemedicine include:  Delay or interruption in medical evaluation due to technological equipment failure or  disruption; Information transmitted may not be sufficient (e.g. poor resolution of images) to allow for appropriate medical decision making by the Practitioner; and/or  In rare instances, security protocols could fail, causing a breach of personal health information.  Furthermore, I acknowledge that it is my responsibility to provide information about my medical history, conditions and care that is complete and accurate to the best of my ability. I acknowledge that Practitioner's advice, recommendations, and/or decision may be based on factors not within their control, such as incomplete or inaccurate data provided by me or distortions of diagnostic images or specimens that may result from electronic transmissions. I understand that the practice of medicine is not an exact science and that Practitioner makes no warranties or guarantees regarding treatment outcomes. I acknowledge that a copy of this consent can be made available to me via my patient portal [facility] MyChart), or I can request a printed copy by calling the office of [facility].    I understand that my insurance will be billed for this visit.   I have read or had this consent read to me. I understand the contents of this consent, which adequately explains the benefits and risks of the Services being provided via telemedicine.  I have been provided ample opportunity to ask questions regarding this consent and the Services and have had my questions answered to my satisfaction. I give my informed consent for the services to be provided through the use of telemedicine in my medical care

## 2023-09-02 NOTE — Telephone Encounter (Signed)
Pt has been scheduled tele pre op appt 09/16/23. Procedure won't be scheduled until the pt has been cleared. Med rec and consent are done.

## 2023-09-02 NOTE — Telephone Encounter (Signed)
Nicole Haynes K4 hours ago (9:31 AM)   AF Patient is returning phone call to set up a tele visit appt.      Note   L[place], Stache "[name]" [phone]  [name]

## 2023-09-16 ENCOUNTER — Ambulatory Visit: Payer: Medicare PPO | Attending: Physician Assistant

## 2023-09-16 DIAGNOSIS — Z01810 Encounter for preprocedural cardiovascular examination: Secondary | ICD-10-CM

## 2023-09-16 NOTE — Progress Notes (Signed)
Virtual Visit via Telephone Note   Because of Nicole Haynes's co-morbid illnesses, she is at least at moderate risk for complications without adequate follow up.  This format is felt to be most appropriate for this patient at this time.  The patient did not have access to video technology/had technical difficulties with video requiring transitioning to audio format only (telephone).  All issues noted in this document were discussed and addressed.  No physical exam could be performed with this format.  Please refer to the patient's chart for her consent to telehealth for [facility].  Evaluation Performed:  Preoperative cardiovascular risk assessment _____________   Date:  09/16/2023   Patient ID:  Nicole Haynes, DOB July 31, 1951, MRN [id number] Patient Location:  Home Provider location:   Office  Primary Care Provider:  [name], MD Primary Cardiologist:  [name], MD  Chief Complaint / Patient Profile   72 y.o. y/o female with a h/o hypothyroidism treated with radioactive iodine for Graves', HLD, and atrial fibrillation status post ablation who is pending B/L upper eyelid blepharoplasty and trichophytic brow lift and presents today for telephonic preoperative cardiovascular risk assessment.  History of Present Illness    Nicole Haynes is a 72 y.o. female who presents via audio/video conferencing for a telehealth visit today.  Pt was last seen in cardiology clinic on 05/28/2023 by Otilio Saber, PA.  At that time Nicole Haynes was doing well .  The patient is now pending procedure as outlined above. Since her last visit, she tells me that sometimes she notices fluttering from time to time.  Sometimes up to the 140s.  She is status post ablation and recently her flecainide and diltiazem were discontinued.  She does have as needed diltiazem to use but she has not used this.  She does still remain very active and plays pickle ball as well as golfs.   She far exceeds the minimum requirement on the DASI.  Overall, not life limiting with her rhythm control but sounds like it could definitely be improved.   Per office protocol, patient can hold Eliquis for 2 days prior to procedure. She can resume when medically to do so.   Reports no shortness of breath nor dyspnea on exertion. Reports no chest pain, pressure, or tightness. No edema, orthopnea, PND.    Past Medical History    Past Medical History:  Diagnosis Date   Dyslipidemia    On rosuvastatin, Krill oral   Hypothyroidism due to Graves' disease status post radiation iodide    On levothyroxine   Paroxysmal atrial fibrillation (HCC)    Rate controlled with diltiazem, rhythm control with flecainide. CHA2DS2Vasc = 2; on Eliquis   Past Surgical History:  Procedure Laterality Date   ATRIAL FIBRILLATION ABLATION N/A 01/27/2023   Procedure: ATRIAL FIBRILLATION ABLATION;  Surgeon: [name], MD;  [facility];  Service: Cardiovascular;  Laterality: N/A;   CARDIAC EVENT MONITOR  11/2017   30 monitor: 98% normal sinus rhythm.  2% PAF with rates as high as 160 bpm.   NM MYOVIEW LTD  12/2017   LOW RISK.  Walk for 9 minutes.  Peak heart rate 179 (116% of MPHR).  No EKG changes to suggest ischemia.  No ischemia or infarction noted on Myoview.  EF 65-70%.   None     TRANSTHORACIC ECHOCARDIOGRAM  11/28/2017   Normal LV size and function.  EF 60-65%.  Wall motion.  Mild AI, Mild-mod TR with  mildly elevated PA pressures.    Allergies  Allergies  Allergen Reactions   Penicillins Swelling and Rash    Home Medications    Prior to Admission medications   Medication Sig Start Date End Date Taking? Authorizing Provider  acetaminophen (TYLENOL) 650 MG CR tablet Take 650 mg by mouth every 8 (eight) hours as needed for pain.    [provider]  Calcium Carb-Cholecalciferol (CALCIUM 600 + D PO) Take 1 tablet by mouth 2 (two) times daily.    [provider]   calcium carbonate (TUMS - DOSED IN MG ELEMENTAL CALCIUM) 500 MG chewable tablet Chew 1 tablet by mouth daily as needed for indigestion or heartburn.    [provider]  Cholecalciferol (VITAMIN D) 50 MCG (2000 UT) tablet Take 2,000 Units by mouth daily.    [provider]  diltiazem (CARDIZEM) 30 MG tablet TAKE 1 TABLET EVERY 4 HOURS AS NEEDED FOR AFIB HEART RATE >100 AS LONG AS TOP BP >100. 03/06/23   Fenton, Clint R, PA  ELIQUIS 5 MG TABS tablet TAKE 1 TABLET BY MOUTH TWICE A DAY 07/09/23   Marykay Lex, MD  fexofenadine (ALLEGRA) 180 MG tablet Take 180 mg by mouth daily as needed for allergies or rhinitis.    [provider]  levothyroxine (SYNTHROID) 88 MCG tablet TAKE 1 CAPSULE BY MOUTH EVERY DAY IN THE MORNING ON EMPTY STOMACH 01/14/19   [provider]  MAGNESIUM PO Take 1 tablet by mouth daily.    [provider]  Menthol, Topical Analgesic, (BIOFREEZE EX) Apply 1 Application topically daily as needed (pain).    [provider]  methocarbamol (ROBAXIN) 750 MG tablet Take 750 mg by mouth every 6 (six) hours as needed.    [provider]  Misc Natural Products (JOINT HEALTH) CAPS Take 1 tablet by mouth daily.    [provider]  Multiple Vitamin (MULTIVITAMIN WITH MINERALS) TABS tablet Take 1 tablet by mouth daily.    [provider]  pantoprazole (PROTONIX) 40 MG tablet Take 1 tablet (40 mg total) by mouth daily. 01/27/23 03/13/23  [name] E, PA-C  Probiotic Product (PROBIOTIC PO) Take 1 capsule by mouth daily.    [provider]  rosuvastatin (CRESTOR) 5 MG tablet Take 5 mg by mouth daily. 10/11/17   [provider]  tretinoin (RETIN-A) 0.025 % cream Apply 1 Application topically at bedtime. 12/03/22   [provider]    Physical Exam    Vital Signs:  [name] does not have vital signs available for review today.  Given telephonic nature of communication,  physical exam is limited. AAOx3. NAD. Normal affect.  Speech and respirations are unlabored.  Accessory Clinical Findings    None  Assessment & Plan    1.  Preoperative Cardiovascular Risk Assessment:  Ms. Curro's perioperative risk of a major cardiac event is 0.4% according to the Revised Cardiac Risk Index (RCRI).  Therefore, she is at low risk for perioperative complications.   Her functional capacity is good at 6.36 METs according to the [facility] Activity Status Index (DASI). Recommendations: According to ACC/AHA guidelines, no further cardiovascular testing needed.  The patient may proceed to surgery at acceptable risk.   Antiplatelet and/or Anticoagulation Recommendations:  Eliquis (Apixaban) can be held for 2 days prior to surgery.  Please resume post op when felt to be safe.     The patient was advised that if she develops new symptoms prior to surgery to contact our  office to arrange for a follow-up visit, and she verbalized understanding.   A copy of this note will be routed to requesting surgeon.  Time:   Today, I have spent 10 minutes with the patient with telehealth technology discussing medical history, symptoms, and management plan.     Sharlene Dory, PA-C  09/16/2023, 2:21 PM

## 2023-09-25 ENCOUNTER — Ambulatory Visit (HOSPITAL_COMMUNITY)
Admission: RE | Admit: 2023-09-25 | Discharge: 2023-09-25 | Disposition: A | Discharge status: Home / Self Care | Payer: Medicare PPO | Source: Ambulatory Visit | Attending: Internal Medicine | Admitting: Internal Medicine

## 2023-09-25 VITALS — BP 112/60 | HR 56 | Ht 64.0 in | Wt 144.6 lb

## 2023-09-25 DIAGNOSIS — I4891 Unspecified atrial fibrillation: Secondary | ICD-10-CM | POA: Diagnosis not present

## 2023-09-25 DIAGNOSIS — E039 Hypothyroidism, unspecified: Secondary | ICD-10-CM | POA: Insufficient documentation

## 2023-09-25 DIAGNOSIS — Z7901 Long term (current) use of anticoagulants: Secondary | ICD-10-CM | POA: Diagnosis not present

## 2023-09-25 DIAGNOSIS — E785 Hyperlipidemia, unspecified: Secondary | ICD-10-CM | POA: Insufficient documentation

## 2023-09-25 DIAGNOSIS — I480 Paroxysmal atrial fibrillation: Secondary | ICD-10-CM | POA: Diagnosis not present

## 2023-09-25 NOTE — Progress Notes (Signed)
Primary Care Physician: [name], MD Primary Cardiologist: Dr Herbie Baltimore Primary Electrophysiologist: Dr Lalla Brothers Referring Physician: Dr Abundio Miu Nicole Haynes is a 72 y.o. female with a history of hypothyroidism s/p radioactive iodine for Graves, HLD, atrial fibrillation who presents for follow up in the [facility].  The patient was previously treated with flecainide for her atrial fibrillation but this was stopped due to conduction system disease. She actually restarted it given her recurrence of symptoms associate with her atrial fibrillation. She was seen by Dr Lalla Brothers and underwent afib ablation on 01/27/23. Patient is on Eliquis for a CHADS2VASC score of 2.  On follow up today, patient reports that she has had 2-3 episodes of afib, mostly in the week following ablation. She denies swallowing pain or groin issues. She did have some chest pain which is improving.   On follow up 09/25/23, she is currently in NSR. Since last office visit in August, she has had possibly 2-3 brief episodes of Afib. She has a FitBit watch that only tells her HR. She noted these episodes were centered around panic attacks and quickly resolved; she was unsure if truly Afib or tachycardia related to anxiety. She is currently taking Eliquis 5 mg BID and does not have any bleeding issues.   Today, she denies symptoms of palpitations, shortness of breath, orthopnea, PND, lower extremity edema, dizziness, presyncope, syncope, snoring, daytime somnolence, bleeding, or neurologic sequela. The patient is tolerating medications without difficulties and is otherwise without complaint today.    Atrial Fibrillation Risk Factors:  she does not have symptoms or diagnosis of sleep apnea. she does not have a history of rheumatic fever.   she has a BMI of Body mass index is 24.82 kg/m.Marland Kitchen Filed Weights   09/25/23 1131  Weight: 65.6 kg     Family History  Problem Relation Age of  Onset   Breast cancer Neg Hx      Atrial Fibrillation Management history:  Previous antiarrhythmic drugs: flecainide Previous cardioversions: none Previous ablations: 01/27/23 Anticoagulation history: Eliquis   Past Medical History:  Diagnosis Date   Dyslipidemia    On rosuvastatin, Krill oral   Hypothyroidism due to Graves' disease status post radiation iodide    On levothyroxine   Paroxysmal atrial fibrillation (HCC)    Rate controlled with diltiazem, rhythm control with flecainide. CHA2DS2Vasc = 2; on Eliquis   Past Surgical History:  Procedure Laterality Date   ATRIAL FIBRILLATION ABLATION N/A 01/27/2023   Procedure: ATRIAL FIBRILLATION ABLATION;  Surgeon: [name], MD;  [facility];  Service: Cardiovascular;  Laterality: N/A;   CARDIAC EVENT MONITOR  11/2017   30 monitor: 98% normal sinus rhythm.  2% PAF with rates as high as 160 bpm.   NM MYOVIEW LTD  12/2017   LOW RISK.  Walk for 9 minutes.  Peak heart rate 179 (116% of MPHR).  No EKG changes to suggest ischemia.  No ischemia or infarction noted on Myoview.  EF 65-70%.   None     TRANSTHORACIC ECHOCARDIOGRAM  11/28/2017   Normal LV size and function.  EF 60-65%.  Wall motion.  Mild AI, Mild-mod TR with mildly elevated PA pressures.    Current Outpatient Medications  Medication Sig Dispense Refill   acetaminophen (TYLENOL) 650 MG CR tablet Take 650 mg by mouth as needed for pain.     Calcium Carb-Cholecalciferol (CALCIUM 600 + D PO) Vicativ- taking 2 chewables by mouth daily  calcium carbonate (TUMS - DOSED IN MG ELEMENTAL CALCIUM) 500 MG chewable tablet Chew 1 tablet by mouth daily as needed for indigestion or heartburn.     Cholecalciferol (VITAMIN D) 50 MCG (2000 UT) tablet Take 2,000 Units by mouth daily.     diltiazem (CARDIZEM) 30 MG tablet TAKE 1 TABLET EVERY 4 HOURS AS NEEDED FOR AFIB HEART RATE >100 AS LONG AS TOP BP >100. 90 tablet 1   ELIQUIS 5 MG TABS tablet TAKE 1 TABLET BY MOUTH  TWICE A DAY 180 tablet 1   fexofenadine (ALLEGRA) 180 MG tablet Take 180 mg by mouth daily as needed for allergies or rhinitis.     levothyroxine (SYNTHROID) 88 MCG tablet TAKE 1 CAPSULE BY MOUTH EVERY DAY IN THE MORNING ON EMPTY STOMACH     MAGNESIUM PO Take 1 tablet by mouth daily.     Menthol, Topical Analgesic, (BIOFREEZE EX) Apply 1 Application topically daily as needed (pain).     methocarbamol (ROBAXIN) 750 MG tablet Take 750 mg by mouth as needed.     Misc Natural Products (JOINT HEALTH) CAPS Take 1 tablet by mouth daily.     Multiple Vitamin (MULTIVITAMIN WITH MINERALS) TABS tablet Take 1 tablet by mouth daily.     Probiotic Product (PROBIOTIC PO) Take 1 capsule by mouth daily.     rosuvastatin (CRESTOR) 5 MG tablet Take 5 mg by mouth daily.  1   tretinoin (RETIN-A) 0.025 % cream Apply 1 Application topically at bedtime.     No current facility-administered medications for this encounter.    Allergies  Allergen Reactions   Penicillins Swelling and Rash   ROS- All systems are reviewed and negative except as per the HPI above.  Physical Exam: Vitals:   09/25/23 1131  BP: 112/60  Pulse: (!) 56  Weight: 65.6 kg  Height: 5\' 4"  (1.626 m)    GEN- The patient is well appearing, alert and oriented x 3 today.   Neck - no JVD or carotid bruit noted Lungs- Clear to ausculation bilaterally, normal work of breathing Heart- Regular rate and rhythm, no murmurs, rubs or gallops, PMI not laterally displaced Extremities- no clubbing, cyanosis, or edema Skin - no rash or ecchymosis noted   Wt Readings from Last 3 Encounters:  09/25/23 65.6 kg  08/04/23 66.2 kg  05/28/23 66.2 kg    EKG today demonstrates  Vent. rate 56 BPM PR interval 126 ms QRS duration 74 ms QT/QTcB 448/432 ms P-R-T axes 50 -12 37 Sinus bradycardia Low voltage QRS Borderline ECG When compared with ECG of 28-May-2023 09:07, PREVIOUS ECG IS PRESENT  Echo 08/12/22 demonstrated 1. Left ventricular ejection  fraction, by estimation, is 65 to 70%. Left  ventricular ejection fraction by 3D volume is 68 %. The left ventricle has  normal function. The left ventricle has no regional wall motion  abnormalities. Left ventricular diastolic parameters are consistent with Grade I diastolic dysfunction (impaired relaxation). The average left ventricular global longitudinal strain is -27.7 %.   2. Right ventricular systolic function is normal. The right ventricular  size is normal. There is normal pulmonary artery systolic pressure. The  estimated right ventricular systolic pressure is 21.0 mmHg.   3. The mitral valve is normal in structure. Trivial mitral valve  regurgitation. No evidence of mitral stenosis.   4. The aortic valve was not well visualized. Aortic valve regurgitation  is mild. No aortic stenosis is present.   5. The inferior vena cava is normal in size with greater  than 50%  respiratory variability, suggesting right atrial pressure of 3 mmHg.   Comparison(s): 11/28/17 EF 60-65%. PA pressure .   Epic records are reviewed at length today.  CHA2DS2-VASc Score = 2  The patient's score is based upon: CHF History: 0 HTN History: 0 Diabetes History: 0 Stroke History: 0 Vascular Disease History: 0 Age Score: 1 Gender Score: 1       ASSESSMENT AND PLAN: 1. Paroxysmal Atrial Fibrillation (ICD10:  I48.0) The patient's CHA2DS2-VASc score is 2, indicating a 2.2% annual risk of stroke.   S/p afib ablation 01/27/23 by Dr. Lalla Brothers.  Patient is currently in NSR. She discontinued flecainide and diltiazem in August and since then has noted 2-3 brief episodes of possibly Afib. After discussion, patient chooses to proceed with conservative observation. She was already interested in purchasing an Apple watch and will plan to do so for rhythm monitoring at home. I advised patient this monitoring at home would be very useful to determine Afib burden and can help confirm whether she would benefit from AAD  or not.    Continue Eliquis 5 mg BID.    Follow up as scheduled with Dr. Lalla Brothers.    Justin Mend, PA-C Afib Clinic [facility] [address] [place], [place] [address] [phone] 09/25/2023 1:46 PM

## 2023-10-09 ENCOUNTER — Other Ambulatory Visit: Payer: Self-pay | Admitting: Student

## 2023-10-16 IMAGING — MG MM DIGITAL SCREENING BILAT W/ TOMO AND CAD
6 of 12 series · 6 of 36 positions shown · non-contrast
Comparison: Previous exam(s).

CLINICAL DATA: Screening.

EXAM:
DIGITAL SCREENING BILATERAL MAMMOGRAM WITH TOMOSYNTHESIS AND CAD
TECHNIQUE: Bilateral screening digital craniocaudal and mediolateral oblique
mammograms were obtained. Bilateral screening digital breast
tomosynthesis was performed. The images were evaluated with
computer-aided detection.

[R CC synth-2D]
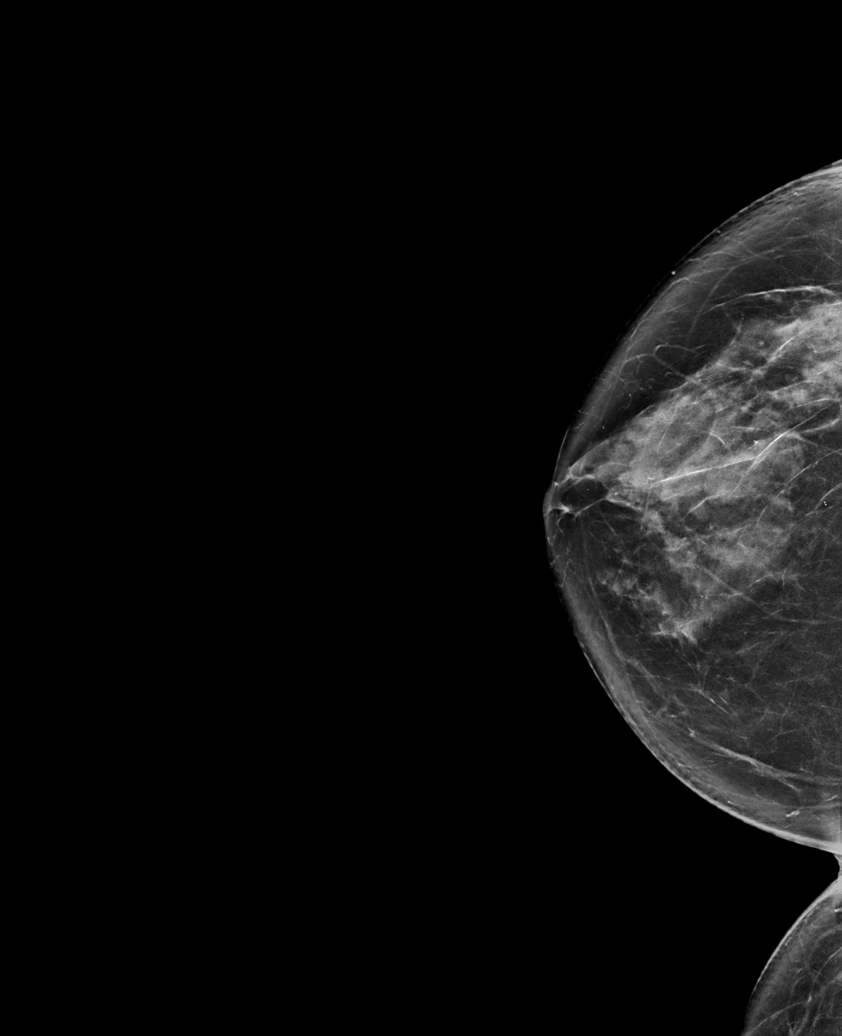

[L MLO synth-2D (1 of 2)]
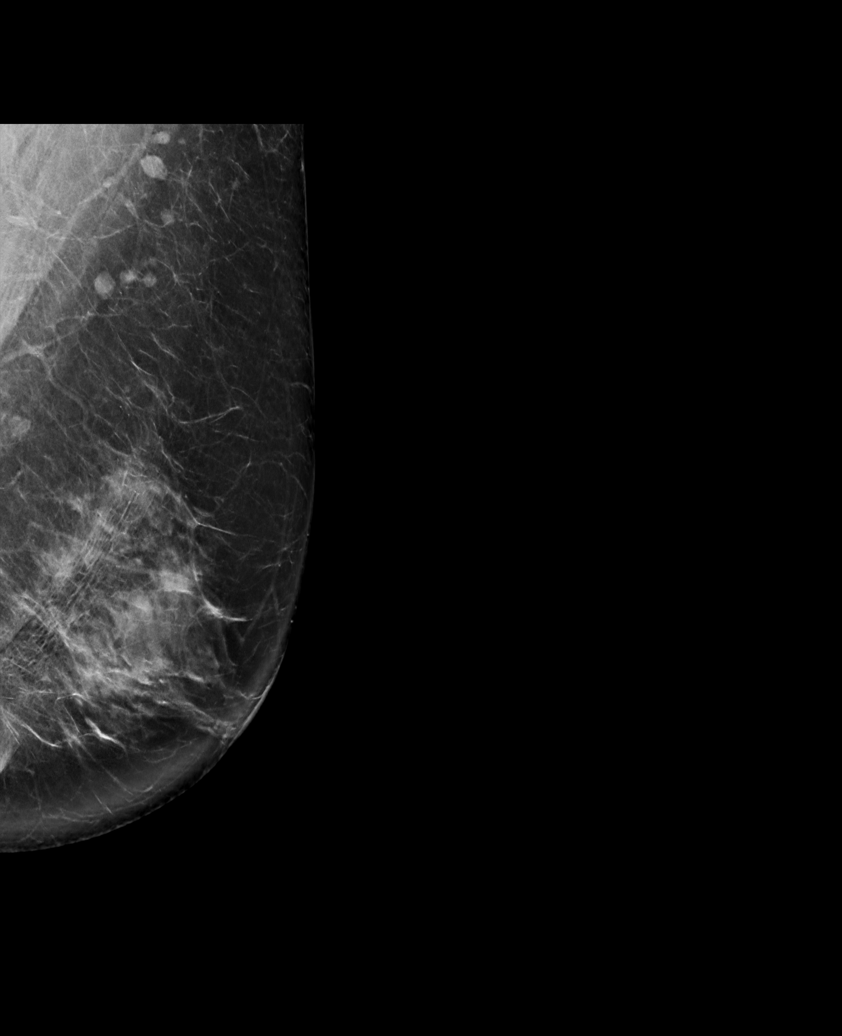

[L MLO synth-2D (2 of 2)]
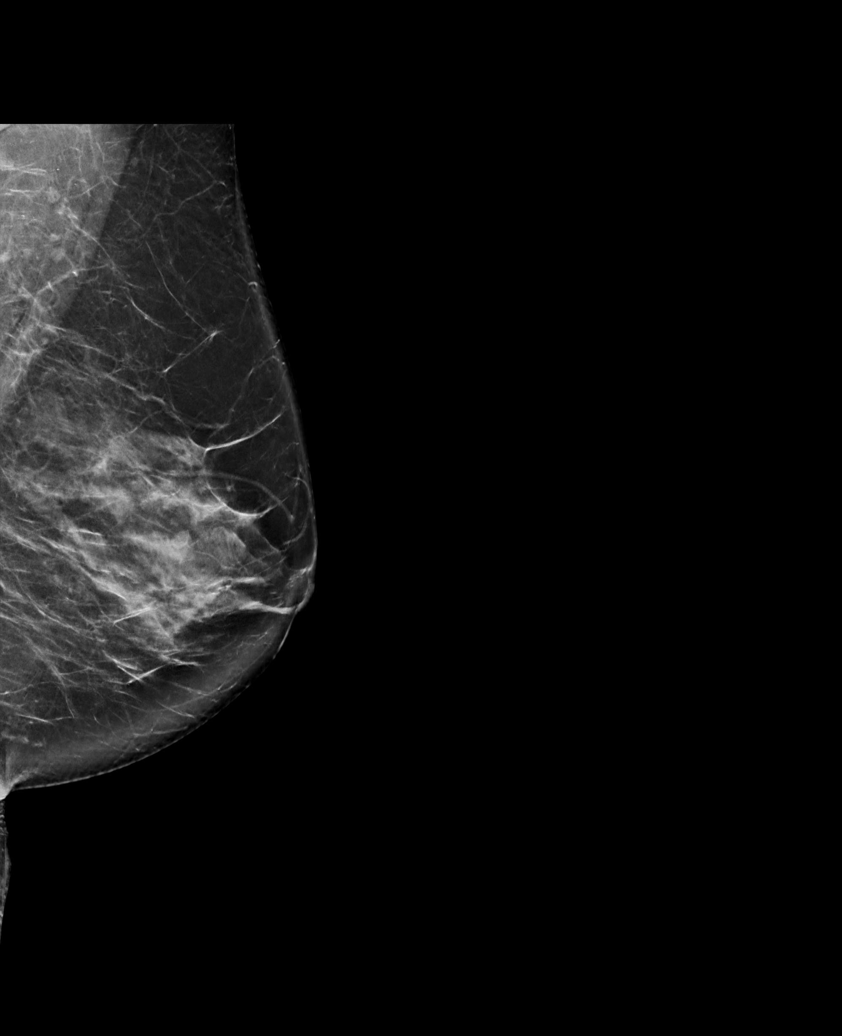

[L CC synth-2D]
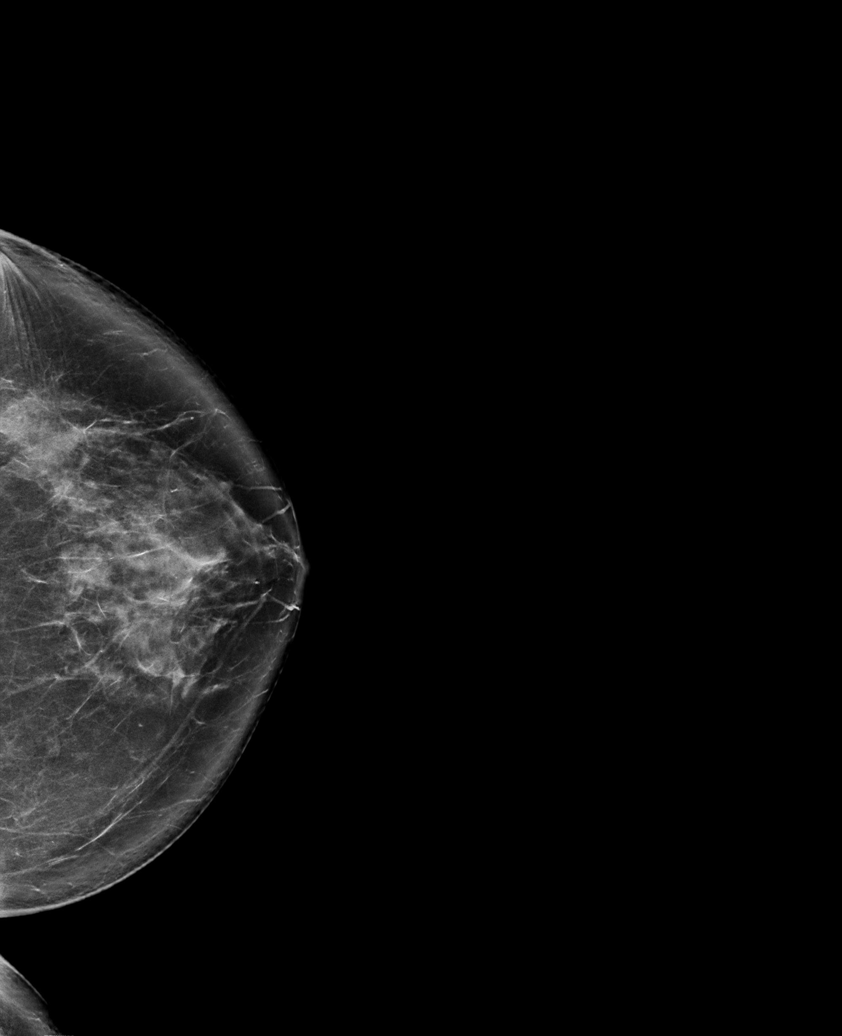

[R XCCL synth-2D]
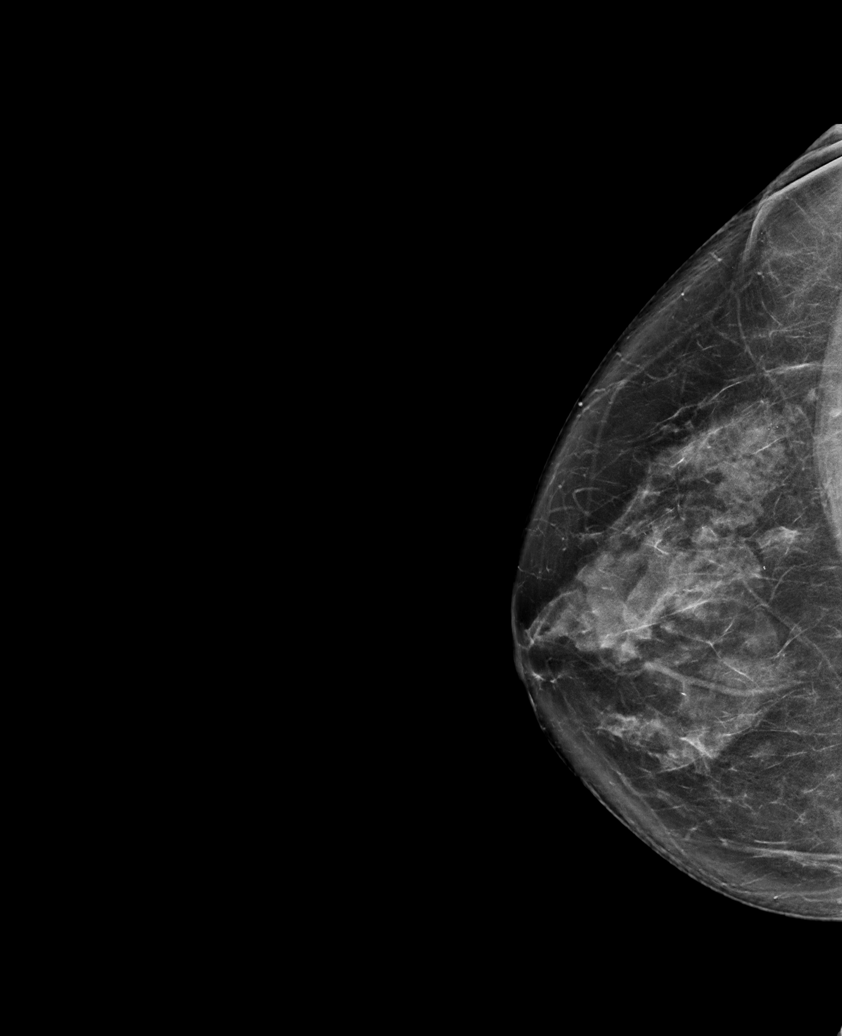

[R MLO synth-2D]
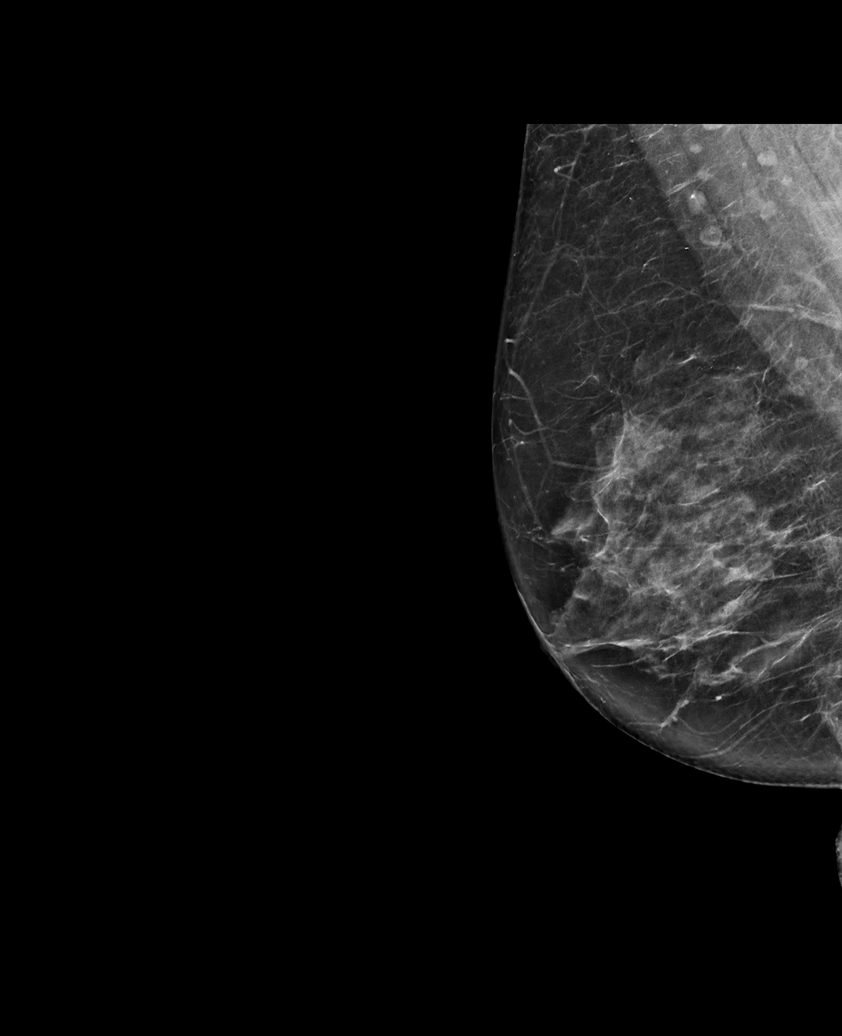

[6 of 36 positions shown; findings below may reference images not displayed]

ACR Breast Density Category c: The breast tissue is heterogeneously
dense, which may obscure small masses.
FINDINGS: There are no findings suspicious for malignancy.
IMPRESSION: No mammographic evidence of malignancy. A result letter of this
screening mammogram will be mailed directly to the patient.

RECOMMENDATION:
Screening mammogram in one year. (Code:[id number]-[id number])

BI-RADS CATEGORY  1: Negative.

## 2023-11-04 ENCOUNTER — Other Ambulatory Visit: Payer: Self-pay | Admitting: Internal Medicine

## 2023-11-04 DIAGNOSIS — Z1231 Encounter for screening mammogram for malignant neoplasm of breast: Secondary | ICD-10-CM

## 2023-11-17 DIAGNOSIS — J019 Acute sinusitis, unspecified: Secondary | ICD-10-CM | POA: Diagnosis not present

## 2023-11-21 ENCOUNTER — Other Ambulatory Visit: Payer: Self-pay | Admitting: Cardiology

## 2023-11-21 DIAGNOSIS — I480 Paroxysmal atrial fibrillation: Secondary | ICD-10-CM

## 2023-11-24 NOTE — Telephone Encounter (Signed)
Prescription refill request for Eliquis received. Indication: PAF Last office visit: 09/25/23 Scr: 1.08 epic 08/04/23 Age: 73 Weight: 66kg

## 2023-12-22 ENCOUNTER — Ambulatory Visit
Admission: RE | Admit: 2023-12-22 | Discharge: 2023-12-22 | Disposition: A | Discharge status: Home / Self Care | Payer: Medicare PPO | Source: Ambulatory Visit | Attending: Internal Medicine | Admitting: Internal Medicine

## 2023-12-22 DIAGNOSIS — Z1231 Encounter for screening mammogram for malignant neoplasm of breast: Secondary | ICD-10-CM | POA: Diagnosis not present

## 2023-12-23 DIAGNOSIS — L578 Other skin changes due to chronic exposure to nonionizing radiation: Secondary | ICD-10-CM | POA: Diagnosis not present

## 2023-12-23 DIAGNOSIS — L570 Actinic keratosis: Secondary | ICD-10-CM | POA: Diagnosis not present

## 2023-12-23 DIAGNOSIS — L814 Other melanin hyperpigmentation: Secondary | ICD-10-CM | POA: Diagnosis not present

## 2023-12-23 DIAGNOSIS — Z411 Encounter for cosmetic surgery: Secondary | ICD-10-CM | POA: Diagnosis not present

## 2023-12-23 DIAGNOSIS — L821 Other seborrheic keratosis: Secondary | ICD-10-CM | POA: Diagnosis not present

## 2023-12-23 DIAGNOSIS — D225 Melanocytic nevi of trunk: Secondary | ICD-10-CM | POA: Diagnosis not present

## 2023-12-31 ENCOUNTER — Encounter: Payer: Self-pay | Admitting: Cardiology

## 2023-12-31 ENCOUNTER — Ambulatory Visit (INDEPENDENT_AMBULATORY_CARE_PROVIDER_SITE_OTHER)

## 2023-12-31 ENCOUNTER — Ambulatory Visit: Payer: Medicare PPO | Attending: Cardiology | Admitting: Cardiology

## 2023-12-31 VITALS — BP 116/66 | HR 61 | Ht 64.0 in | Wt 149.8 lb

## 2023-12-31 DIAGNOSIS — I480 Paroxysmal atrial fibrillation: Secondary | ICD-10-CM

## 2023-12-31 NOTE — Progress Notes (Signed)
  Electrophysiology Office Follow up Visit Note:    Date:  12/31/2023   ID:  Nicole Haynes, DOB 17-Apr-1951, MRN [id number]  PCP:  [name], MD  [facility] Cardiologist:  [name], MD  [facility] Electrophysiologist:  [name], MD    Interval History:     Nicole Haynes is a 73 y.o. female who presents for a follow up visit.   Nicole Haynes has a history of atrial fibrillation.  She had a prior catheter ablation for her A-fib on January 27, 2023.  During that procedure, the pulmonary veins were isolated.  She takes Eliquis for stroke prophylaxis.  She saw Jonny Ruiz in the A-fib clinic September 25, 2023.  She had no confirmed episodes of atrial fibrillation at that appointment.  Today she tells me that several months ago she was feeling palpitations after she ate.  They would last around 10 minutes or so and then subside.  She is unsure whether or not this was atrial fibrillation or not.  These episodes seem to have improved recently.  She tells me that she is also struggling with acid reflux and was unsure whether or not that could be the cause of some of the symptoms.  She is considering getting an Apple Watch for monitoring of atrial fibrillation.        Past medical, surgical, social and family history were reviewed.  ROS:   Please see the history of present illness.    All other systems reviewed and are negative.  EKGs/Labs/Other Studies Reviewed:    The following studies were reviewed today:     EKG Interpretation Date/Time:  Wednesday December 31 2023 16:03:31 EDT Ventricular Rate:  61 PR Interval:  144 QRS Duration:  74 QT Interval:  428 QTC Calculation: 430 R Axis:   -42  Text Interpretation: Normal sinus rhythm Left axis deviation Low voltage QRS Confirmed by Steffanie Dunn [phone]) on 12/31/2023 4:30:12 PM    Physical Exam:    VS:  BP 116/66   Pulse 61   Ht 5\' 4"  (1.626 m)   Wt 149 lb 12.8 oz (67.9 kg)   SpO2 97%   BMI 25.71  kg/m     Wt Readings from Last 3 Encounters:  12/31/23 149 lb 12.8 oz (67.9 kg)  09/25/23 144 lb 9.6 oz (65.6 kg)  08/04/23 146 lb (66.2 kg)     GEN: no distress CARD: RRR, No MRG RESP: No IWOB. CTAB.      ASSESSMENT:    1. Paroxysmal atrial fibrillation (HCC)    PLAN:    In order of problems listed above:  #Atrial fibrillation With prior catheter ablation in April 2024.  Is doing well without confirmed sustained recurrence of arrhythmia. Continue Eliquis for stroke prophylaxis. I would like to get a 2-week ZIO monitor to better assess her episodes at mealtime with palpitations.  I will have her wear it for 2 weeks and then follow-up with an APP in about 8 weeks.  If she is having recurrence of arrhythmia, favor repeat catheter ablation given her young age and overall state of health.  Follow-up with EP APP in 8 weeks.  Signed, Steffanie Dunn, MD, [facility], [facility] 12/31/2023 4:30 PM    Electrophysiology [facility]

## 2023-12-31 NOTE — Progress Notes (Unsigned)
 Enrolled patient ofr a 14 day Zio XT monitor to be mailed to patients home

## 2023-12-31 NOTE — Patient Instructions (Signed)
 Medication Instructions:  Your physician recommends that you continue on your current medications as directed. Please refer to the Current Medication list given to you today.   *If you need a refill on your cardiac medications before your next appointment, please call your pharmacy*  Testing/Procedures: Event Monitor Your physician has recommended that you wear an event monitor. Event monitors are medical devices that record the heart's electrical activity. Doctors most often [place] these monitors to diagnose arrhythmias. Arrhythmias are problems with the speed or rhythm of the heartbeat. The monitor is a small, portable device. You can wear one while you do your normal daily activities. This is usually used to diagnose what is causing palpitations/syncope (passing out).  Follow-Up: At [facility], you and your health needs are our priority.  As part of our continuing mission to provide you with exceptional heart care, we have created designated Provider Care Teams.  These Care Teams include your primary Cardiologist (physician) and Advanced Practice Providers (APPs -  Physician Assistants and Nurse Practitioners) who all work together to provide you with the care you need, when you need it.   Your next appointment:   8 weeks  Provider:   You will see one of the following Advanced Practice Providers on your designated Care Team:   Francis Dowse, Charlott Holler "Mardelle Matte" [place], [place] Sherie Don, NP Canary Brim, NP  Other Instructions Christena Deem- Long Term Monitor Instructions  Your physician has requested you wear a ZIO patch monitor for 14 days.  This is a single patch monitor. [employer] supplies one patch monitor per enrollment. Additional stickers are not available. Please do not apply patch if you will be having a Nuclear Stress Test,  Echocardiogram, Cardiac CT, MRI, or Chest Xray during the period you would be wearing the  monitor. The patch cannot be worn during these tests. You  cannot remove and re-apply the  ZIO XT patch monitor.  Your ZIO patch monitor will be mailed 3 day USPS to your address on file. It may take 3-5 days  to receive your monitor after you have been enrolled.  Once you have received your monitor, please review the enclosed instructions. Your monitor  has already been registered assigning a specific monitor serial # to you.  Billing and Patient Assistance Program Information  We have supplied [employer] with any of your insurance information on file for billing purposes. [employer] offers a sliding scale Patient Assistance Program for patients that do not have  insurance, or whose insurance does not completely cover the cost of the ZIO monitor.  You must apply for the Patient Assistance Program to qualify for this discounted rate.  To apply, please call [employer] at [phone], select option 4, select option 2, ask to apply for  Patient Assistance Program. Meredeth Ide will ask your household income, and how many people  are in your household. They will quote your out-of-pocket cost based on that information.  [employer] will also be able to set up a 68-month, interest-free payment plan if needed.  Applying the monitor   Shave hair from upper left chest.  Hold abrader disc by orange tab. Rub abrader in 40 strokes over the upper left chest as  indicated in your monitor instructions.  Clean area with 4 enclosed alcohol pads. Let dry.  Apply patch as indicated in monitor instructions. Patch will be placed under collarbone on left  side of chest with arrow pointing upward.  Rub patch adhesive wings for 2 minutes. Remove white label marked "1".  Remove the white  label marked "2". Rub patch adhesive wings for 2 additional minutes.  While looking in a mirror, press and release button in center of patch. A small green light will  flash 3-4 times. This will be your only indicator that the monitor has been turned on.  Do not shower for the first 24 hours. You may  shower after the first 24 hours.  Press the button if you feel a symptom. You will hear a small click. Record Date, Time and  Symptom in the Patient Logbook.  When you are ready to remove the patch, follow instructions on the last 2 pages of Patient  Logbook. Stick patch monitor onto the last page of Patient Logbook.  Place Patient Logbook in the blue and white box. Use locking tab on box and tape box closed  securely. The blue and white box has prepaid postage on it. Please place it in the mailbox as  soon as possible. Your physician should have your test results approximately 7 days after the  monitor has been mailed back to [facility].  Call [facility] Customer Care at [phone] if you have questions regarding  your ZIO XT patch monitor. Call them immediately if you see an orange light blinking on your  monitor.  If your monitor falls off in less than 4 days, contact our Monitor department at [phone].  If your monitor becomes loose or falls off after 4 days call [employer] at [phone] for  suggestions on securing your monitor

## 2024-01-13 NOTE — Progress Notes (Deleted)
  Cardiology Office Note   Date:  01/13/2024  ID:  Lovell, Roe 04-17-1951, MRN [id number] PCP:  [name], MD [facility] Cardiologist: [name], MD  Reason for visit: 69-month follow-up  History of Present Illness    Nicole Haynes is a 73 y.o. female with a hx of atrial fibrillation.  In 2019, Patient had a low risk stress, echo with normal EF, normal LA size, mild AI, mild-mod TR.  Monitor showed 2% a-fib burden with ventricular rate to 160 bpm. Patient had breakthrough A Fib event in December 2022 while off of antinodal and antiarrhythmic agents, restarted on diltiazem and flecainide.  With fatigue, flecainide decreased to 25 mg twice daily.   I last saw her in September 2023 and referred her to EP to discuss A-fib ablation.  She had a catheter ablation for her A-fib on January 27, 2023 with Dr. Steffanie Dunn.  She last saw Dr. Lalla Brothers December 31, 2023.  She is doing well without confirmed sustained recurrence of arrhythmia.  Recommend 2 weeks ZIO monitor to assess palpitations at mealtime.  Dr. Lalla Brothers noted that patient is having recurrence of arrhythmia, he would favor repeat catheter ablation given young age and overall state of health.  Zio monitor not resulted yet.  Today, ***  Atrial fibrillation -***  Hyperlipidemia -*** -Recommend cholesterol lowering diets - Mediterranean diet, DASH diet, vegetarian diet, low-carbohydrate diet and avoidance of trans fats.  Discussed healthier choice substitutes.  Nuts, high-fiber foods, and fiber supplements may also improve lipids.    Disposition - Follow-up in ***     Objective / Physical Exam   EKG today: ***  Vital signs:  There were no vitals taken for this visit.    GEN: No acute distress NECK: No carotid bruits CARDIAC: ***RRR, no murmurs RESPIRATORY:  Clear to auscultation without rales, wheezing or rhonchi  EXTREMITIES: No edema  Assessment and Plan   ***   {Are you ordering a CV  Procedure (e.g. stress test, cath, DCCV, TEE, etc)?   Press F2        :[id number]}    Signed, Bernette Mayers  01/13/2024 [facility]

## 2024-01-14 ENCOUNTER — Ambulatory Visit: Payer: Medicare PPO | Admitting: Physician Assistant

## 2024-01-29 DIAGNOSIS — I480 Paroxysmal atrial fibrillation: Secondary | ICD-10-CM | POA: Diagnosis not present

## 2024-01-31 DIAGNOSIS — I480 Paroxysmal atrial fibrillation: Secondary | ICD-10-CM

## 2024-02-12 DIAGNOSIS — M9902 Segmental and somatic dysfunction of thoracic region: Secondary | ICD-10-CM | POA: Diagnosis not present

## 2024-02-12 DIAGNOSIS — M542 Cervicalgia: Secondary | ICD-10-CM | POA: Diagnosis not present

## 2024-02-12 DIAGNOSIS — M9901 Segmental and somatic dysfunction of cervical region: Secondary | ICD-10-CM | POA: Diagnosis not present

## 2024-02-12 DIAGNOSIS — M9904 Segmental and somatic dysfunction of sacral region: Secondary | ICD-10-CM | POA: Diagnosis not present

## 2024-02-12 DIAGNOSIS — M9903 Segmental and somatic dysfunction of lumbar region: Secondary | ICD-10-CM | POA: Diagnosis not present

## 2024-02-16 DIAGNOSIS — I480 Paroxysmal atrial fibrillation: Secondary | ICD-10-CM | POA: Diagnosis not present

## 2024-02-16 DIAGNOSIS — E559 Vitamin D deficiency, unspecified: Secondary | ICD-10-CM | POA: Diagnosis not present

## 2024-02-16 DIAGNOSIS — E7800 Pure hypercholesterolemia, unspecified: Secondary | ICD-10-CM | POA: Diagnosis not present

## 2024-02-16 DIAGNOSIS — H6121 Impacted cerumen, right ear: Secondary | ICD-10-CM | POA: Diagnosis not present

## 2024-02-16 DIAGNOSIS — F419 Anxiety disorder, unspecified: Secondary | ICD-10-CM | POA: Diagnosis not present

## 2024-02-16 DIAGNOSIS — M8580 Other specified disorders of bone density and structure, unspecified site: Secondary | ICD-10-CM | POA: Diagnosis not present

## 2024-02-16 DIAGNOSIS — R42 Dizziness and giddiness: Secondary | ICD-10-CM | POA: Diagnosis not present

## 2024-02-16 DIAGNOSIS — Z0000 Encounter for general adult medical examination without abnormal findings: Secondary | ICD-10-CM | POA: Diagnosis not present

## 2024-02-16 DIAGNOSIS — D6869 Other thrombophilia: Secondary | ICD-10-CM | POA: Diagnosis not present

## 2024-02-16 DIAGNOSIS — N183 Chronic kidney disease, stage 3 unspecified: Secondary | ICD-10-CM | POA: Diagnosis not present

## 2024-02-16 DIAGNOSIS — E039 Hypothyroidism, unspecified: Secondary | ICD-10-CM | POA: Diagnosis not present

## 2024-02-16 LAB — LAB REPORT - SCANNED
EGFR: 57
TSH: 0.42 (ref 0.41–5.90)

## 2024-02-22 NOTE — Progress Notes (Unsigned)
 Cardiology Office Note:  .   Date:  02/22/2024  ID:  Nicole Haynes, DOB [birth year], MRN [id number] PCP: Elester Grim, MD  [facility] Providers Cardiologist:  Randene Bustard, MD Electrophysiologist:  Boyce Byes, MD {  History of Present Illness: .   Nicole Haynes is a 73 y.o. female w/PMHx of  HTN, HLD Graves (s/p radioactive iodine) AFib  Dec 2024, sw the AFib clinic reporting a few brief episodes of palpitations/poss AFib she attributed to panic attacks No changes were made, planned to look into watch tech to monitor rhythm  She saw Dr. Marven Slimmer 12/31/23, again some brief palpitations she was unsure of some thought perhaps 2/2 GERD. Planned for monitoring to try and clarify what she was feeling  April 2025 monitor HR 42 - 184, average 65 bpm. 6 nonsustained SVT, longest 7 beats. 5% burden of atrial flutter, average rate 147 bpm. Rare supraventricular and ventricular ectopy. Symptom trigger episodes correspond to atrial flutter.  Today's visit is scheduled as monitor f/u ROS:   Her palpitations have improved since her visit with Dr. Marven Slimmer, but not resolved. She finds that when she has "maybe over done it" or under a lot of stress, can trigger them, she will sit/relax, and they settle away. Last a few minutes perhaps, never hours or all day Had an episode Monday  Otherwise doing very well No CP, SOB No near syncope or syncope No bleeding or signs fo bleeding   Arrhythmia/AAD hx Flecainide  stopped w/development of 1st degree AVblock AFib ablation 01/27/23  Studies Reviewed: Aaron Aas    EKG not done today   01/27/23: EPS, ablation CONCLUSIONS: 1. Successful PVI 2. Intracardiac echo reveals trivial pericardial effusion, left sided common PV ostium 3. No early apparent complications. 4. Colchicine  0.6mg  PO BID x 5 days 5. Protonix  40mg  PO daily x 45 days   12/10/22: Cardiac CT IMPRESSION: 1. There is normal pulmonary vein drainage into the  left atrium with ostial measurements above. 2. There is no thrombus in the left atrial appendage. 3. The esophagus runs in the left atrial midline and is in close proximity to the left upper and left lower pulmonary vein ostia. 4. No PFO/ASD. 5. Normal coronary origin. Left dominance. 6. CAC score of 0 which is 0 percentile for age-, race-, and sex-matched controls.   Echo 08/12/22 1. Left ventricular ejection fraction, by estimation, is 65 to 70%. Left  ventricular ejection fraction by 3D volume is 68 %. The left ventricle has  normal function. The left ventricle has no regional wall motion  abnormalities. Left ventricular diastolic parameters are consistent with Grade I diastolic dysfunction (impaired relaxation). The average left ventricular global longitudinal strain is -27.7 %.   2. Right ventricular systolic function is normal. The right ventricular  size is normal. There is normal pulmonary artery systolic pressure. The  estimated right ventricular systolic pressure is 21.0 mmHg.   3. The mitral valve is normal in structure. Trivial mitral valve  regurgitation. No evidence of mitral stenosis.   4. The aortic valve was not well visualized. Aortic valve regurgitation  is mild. No aortic stenosis is present.   5. The inferior vena cava is normal in size with greater than 50%  respiratory variability, suggesting right atrial pressure of 3 mmHg.   Comparison(s): 11/28/17 EF 60-65%. PA pressure .    Risk Assessment/Calculations:    Physical Exam:   VS:  There were no vitals taken for this visit.   Wt Readings from  Last 3 Encounters:  12/31/23 149 lb 12.8 oz (67.9 kg)  09/25/23 144 lb 9.6 oz (65.6 kg)  08/04/23 146 lb (66.2 kg)    GEN: Well nourished, well developed in no acute distress NECK: No JVD; No carotid bruits CARDIAC: RRR, no murmurs, rubs, gallops RESPIRATORY:   CTA b/l without rales, wheezing or rhonchi  ABDOMEN: Soft, non-tender, non-distended EXTREMITIES:   No edema; No deformity   ASSESSMENT AND PLAN: .    paroxysmal AFib AFlutter CHA2DS2Vasc is 3, on Eliquis , appropriately dosed Case reviewed with Dr. Marven Slimmer : plan for a redo ablation. Redo the veins if reconnected, add the posterior wall and do the CTI   She would like 1st to try an as needed medication strategy Would like to avoid more daily medicines Would be open to repeat ablation in the future if burden were to increase again  Wi;l start diltiazem  30mg  Q8 PRN  HTN Looks ok  Secondary hypercoagulable state 2/2 AFib    Dispo: back in 3-48mo, sooner if needed  Signed, Debbie Fails, PA-C

## 2024-02-25 ENCOUNTER — Ambulatory Visit: Attending: Physician Assistant | Admitting: Physician Assistant

## 2024-02-25 VITALS — BP 126/64 | HR 58 | Ht 64.0 in | Wt 150.0 lb

## 2024-02-25 DIAGNOSIS — D6869 Other thrombophilia: Secondary | ICD-10-CM

## 2024-02-25 DIAGNOSIS — I4892 Unspecified atrial flutter: Secondary | ICD-10-CM | POA: Diagnosis not present

## 2024-02-25 DIAGNOSIS — I10 Essential (primary) hypertension: Secondary | ICD-10-CM | POA: Diagnosis not present

## 2024-02-25 DIAGNOSIS — I480 Paroxysmal atrial fibrillation: Secondary | ICD-10-CM | POA: Diagnosis not present

## 2024-02-25 MED ORDER — DILTIAZEM HCL 30 MG PO TABS: 30.0000 mg | ORAL_TABLET | Freq: Three times a day (TID) | ORAL | 1 refills | Status: DC | PRN

## 2024-02-25 NOTE — Patient Instructions (Signed)
 Medication Instructions:   START TAKING:  DILTIAZEM  30 MG AS NEEDED FOR PALPITATIONS EVERY 8 HOURS   *If you need a refill on your cardiac medications before your next appointment, please call your pharmacy*   Lab Work: CBC TODAY FIRST FLOOR    If you have labs (blood work) drawn today and your tests are completely normal, you will receive your results only by: MyChart Message (if you have MyChart) OR A paper copy in the mail If you have any lab test that is abnormal or we need to change your treatment, we will call you to review the results.  Testing/Procedures: NONE ORDERED  TODAY    Follow-Up: At [facility], you and your health needs are our priority.  As part of our continuing mission to provide you with exceptional heart care, our providers are all part of one team.  This team includes your primary Cardiologist (physician) and Advanced Practice Providers or APPs (Physician Assistants and Nurse Practitioners) who all work together to provide you with the care you need, when you need it.  Your next appointment:   3-4 month(s) ( CONTACT  CASSIE HALL/ ANGELINE HAMMER FOR EP SCHEDULING ISSUES )   Provider:   Harvie Liner, MD or Mertha Abrahams, PA-C    We recommend signing up for the patient portal called "MyChart".  Sign up information is provided on this After Visit Summary.  MyChart is used to connect with patients for Virtual Visits (Telemedicine).  Patients are able to view lab/test results, encounter notes, upcoming appointments, etc.  Non-urgent messages can be sent to your provider as well.   To learn more about what you can do with MyChart, go to [email or web address].   Other Instructions

## 2024-02-26 DIAGNOSIS — M9902 Segmental and somatic dysfunction of thoracic region: Secondary | ICD-10-CM | POA: Diagnosis not present

## 2024-02-26 DIAGNOSIS — M9901 Segmental and somatic dysfunction of cervical region: Secondary | ICD-10-CM | POA: Diagnosis not present

## 2024-02-26 DIAGNOSIS — M9904 Segmental and somatic dysfunction of sacral region: Secondary | ICD-10-CM | POA: Diagnosis not present

## 2024-02-26 DIAGNOSIS — M542 Cervicalgia: Secondary | ICD-10-CM | POA: Diagnosis not present

## 2024-02-26 DIAGNOSIS — M9903 Segmental and somatic dysfunction of lumbar region: Secondary | ICD-10-CM | POA: Diagnosis not present

## 2024-02-26 LAB — CBC
Hematocrit: 42.1 % (ref 34.0–46.6)
Hemoglobin: 13.8 g/dL (ref 11.1–15.9)
MCH: 29.1 pg (ref 26.6–33.0)
MCHC: 32.8 g/dL (ref 31.5–35.7)
MCV: 89 fL (ref 79–97)
Platelets: 264 10*3/uL (ref 150–450)
RBC: 4.75 x10E6/uL (ref 3.77–5.28)
RDW: 13.1 % (ref 11.7–15.4)
WBC: 5.2 10*3/uL (ref 3.4–10.8)

## 2024-03-19 ENCOUNTER — Other Ambulatory Visit: Payer: Self-pay | Admitting: Physician Assistant

## 2024-03-20 DIAGNOSIS — J329 Chronic sinusitis, unspecified: Secondary | ICD-10-CM | POA: Diagnosis not present

## 2024-03-20 DIAGNOSIS — S0993XA Unspecified injury of face, initial encounter: Secondary | ICD-10-CM | POA: Diagnosis not present

## 2024-03-20 DIAGNOSIS — Z8679 Personal history of other diseases of the circulatory system: Secondary | ICD-10-CM | POA: Diagnosis not present

## 2024-03-20 DIAGNOSIS — W228XXA Striking against or struck by other objects, initial encounter: Secondary | ICD-10-CM | POA: Diagnosis not present

## 2024-03-20 DIAGNOSIS — R220 Localized swelling, mass and lump, head: Secondary | ICD-10-CM | POA: Diagnosis not present

## 2024-03-20 DIAGNOSIS — S0181XA Laceration without foreign body of other part of head, initial encounter: Secondary | ICD-10-CM | POA: Diagnosis not present

## 2024-03-20 DIAGNOSIS — Z7901 Long term (current) use of anticoagulants: Secondary | ICD-10-CM | POA: Diagnosis not present

## 2024-03-20 DIAGNOSIS — S0990XA Unspecified injury of head, initial encounter: Secondary | ICD-10-CM | POA: Diagnosis not present

## 2024-03-20 DIAGNOSIS — Z79899 Other long term (current) drug therapy: Secondary | ICD-10-CM | POA: Diagnosis not present

## 2024-03-22 DIAGNOSIS — S0181XA Laceration without foreign body of other part of head, initial encounter: Secondary | ICD-10-CM | POA: Diagnosis not present

## 2024-04-22 DIAGNOSIS — S0990XD Unspecified injury of head, subsequent encounter: Secondary | ICD-10-CM | POA: Diagnosis not present

## 2024-04-22 DIAGNOSIS — R220 Localized swelling, mass and lump, head: Secondary | ICD-10-CM | POA: Diagnosis not present

## 2024-04-22 DIAGNOSIS — D6869 Other thrombophilia: Secondary | ICD-10-CM | POA: Diagnosis not present

## 2024-04-22 DIAGNOSIS — E039 Hypothyroidism, unspecified: Secondary | ICD-10-CM | POA: Diagnosis not present

## 2024-04-22 DIAGNOSIS — N183 Chronic kidney disease, stage 3 unspecified: Secondary | ICD-10-CM | POA: Diagnosis not present

## 2024-04-22 DIAGNOSIS — Z9181 History of falling: Secondary | ICD-10-CM | POA: Diagnosis not present

## 2024-05-26 NOTE — Progress Notes (Unsigned)
 Cardiology Office Note:  .   Date:  05/26/2024  ID:  Sharlet Clayborne P[place], DOB [birth year], MRN [id number] PCP: Vernon Velna SAUNDERS, MD  [facility] Providers Cardiologist:  Alm Clay, MD Electrophysiologist:  OLE ONEIDA HOLTS, MD {  History of Present Illness: .   Nicole Haynes is a 73 y.o. female w/PMHx of  HTN, HLD Graves (s/p radioactive iodine) AFib  Dec 2024, sw the AFib clinic reporting a few brief episodes of palpitations/poss AFib she attributed to panic attacks No changes were made, planned to look into watch tech to monitor rhythm  She saw Dr. HOLTS 12/31/23, again some brief palpitations she was unsure of some thought perhaps 2/2 GERD. Planned for monitoring to try and clarify what she was feeling  April 2025 monitor HR 42 - 184, average 65 bpm. 6 nonsustained SVT, longest 7 beats. 5% burden of atrial flutter, average rate 147 bpm. Rare supraventricular and ventricular ectopy. Symptom trigger episodes correspond to atrial flutter.  I saw her 02/25/24 Case reviewed with Dr. HOLTS : plan for a redo ablation. Redo the veins if reconnected, add the posterior wall and do the CTI  She wanted 1st to try an as needed medication strategy Would like to avoid more daily medicines Would be open to repeat ablation in the future if burden were to increase again Started on diltiazem  30mg  Q8 PRN  Today's visit is scheduled as planned f/u ROS:   She is doing very well Has fairly brief episodes of a feeling of tremulousness/off that she feels is AFib though no use of PRN dilt. These are few/far between generally and remain much improved.  No CP, SOB, DOE No near syncope or syncope No bleeding or signs of bleeding  Initial HR by rooming MA was 103, by my check 55-57bpm She reports her Fit bit HRs generally 50's-70's  PMD does labs, she reports being told she has stage III CKD   Arrhythmia/AAD hx Flecainide  stopped w/development of 1st degree AVblock AFib  ablation 01/27/23  Studies Reviewed: SABRA    EKG not done today   01/27/23: EPS, ablation CONCLUSIONS: 1. Successful PVI 2. Intracardiac echo reveals trivial pericardial effusion, left sided common PV ostium 3. No early apparent complications. 4. Colchicine  0.6mg  PO BID x 5 days 5. Protonix  40mg  PO daily x 45 days   12/10/22: Cardiac CT IMPRESSION: 1. There is normal pulmonary vein drainage into the left atrium with ostial measurements above. 2. There is no thrombus in the left atrial appendage. 3. The esophagus runs in the left atrial midline and is in close proximity to the left upper and left lower pulmonary vein ostia. 4. No PFO/ASD. 5. Normal coronary origin. Left dominance. 6. CAC score of 0 which is 0 percentile for age-, race-, and sex-matched controls.   Echo 08/12/22 1. Left ventricular ejection fraction, by estimation, is 65 to 70%. Left  ventricular ejection fraction by 3D volume is 68 %. The left ventricle has  normal function. The left ventricle has no regional wall motion  abnormalities. Left ventricular diastolic parameters are consistent with Grade I diastolic dysfunction (impaired relaxation). The average left ventricular global longitudinal strain is -27.7 %.   2. Right ventricular systolic function is normal. The right ventricular  size is normal. There is normal pulmonary artery systolic pressure. The  estimated right ventricular systolic pressure is 21.0 mmHg.   3. The mitral valve is normal in structure. Trivial mitral valve  regurgitation. No evidence of mitral stenosis.   4.  The aortic valve was not well visualized. Aortic valve regurgitation  is mild. No aortic stenosis is present.   5. The inferior vena cava is normal in size with greater than 50%  respiratory variability, suggesting right atrial pressure of 3 mmHg.   Comparison(s): 11/28/17 EF 60-65%. PA pressure .    Risk Assessment/Calculations:    Physical Exam:   VS:  There were no vitals  taken for this visit.   Wt Readings from Last 3 Encounters:  02/25/24 150 lb (68 kg)  12/31/23 149 lb 12.8 oz (67.9 kg)  09/25/23 144 lb 9.6 oz (65.6 kg)    GEN: Well nourished, well developed in no acute distress NECK: No JVD; No carotid bruits CARDIAC: RRR, no murmurs, rubs, gallops RESPIRATORY: CTA b/l without rales, wheezing or rhonchi  ABDOMEN: Soft, non-tender, non-distended EXTREMITIES: No edema; No deformity   ASSESSMENT AND PLAN: .    paroxysmal AFib AFlutter CHA2DS2Vasc is 3, on Eliquis , appropriately dosed   Case has been previously reviewed with Dr. Cindie : REC:  for a redo ablation. Redo the veins if reconnected, add the posterior wall and do the CTI   She has preferred a more conservative approach Would like to avoid more daily medicines Would be open to repeat ablation in the future if burden were to increase again She I very happy with her arrhythmia burden   HTN Looks ok  Secondary hypercoagulable state 2/2 AFib    Dispo: back in 1 year, sooner if needed  Signed, Charlies Macario Arthur, PA-C

## 2024-05-27 ENCOUNTER — Ambulatory Visit: Attending: Physician Assistant | Admitting: Physician Assistant

## 2024-05-27 ENCOUNTER — Encounter: Payer: Self-pay | Admitting: Physician Assistant

## 2024-05-27 VITALS — BP 114/70 | HR 103 | Ht 64.0 in | Wt 149.0 lb

## 2024-05-27 DIAGNOSIS — I480 Paroxysmal atrial fibrillation: Secondary | ICD-10-CM

## 2024-05-27 DIAGNOSIS — I4892 Unspecified atrial flutter: Secondary | ICD-10-CM | POA: Diagnosis not present

## 2024-05-27 DIAGNOSIS — D6869 Other thrombophilia: Secondary | ICD-10-CM

## 2024-05-27 DIAGNOSIS — I10 Essential (primary) hypertension: Secondary | ICD-10-CM

## 2024-05-27 NOTE — Patient Instructions (Signed)
 Medication Instructions:   Your physician recommends that you continue on your current medications as directed. Please refer to the Current Medication list given to you today.   *If you need a refill on your cardiac medications before your next appointment, please call your pharmacy*   Lab Work: NONE ORDERED  TODAY     If you have labs (blood work) drawn today and your tests are completely normal, you will receive your results only by: MyChart Message (if you have MyChart) OR A paper copy in the mail If you have any lab test that is abnormal or we need to change your treatment, we will call you to review the results.  Testing/Procedures: NONE ORDERED  TODAY    Follow-Up: At [facility], you and your health needs are our priority.  As part of our continuing mission to provide you with exceptional heart care, our providers are all part of one team.  This team includes your primary Cardiologist (physician) and Advanced Practice Providers or APPs (Physician Assistants and Nurse Practitioners) who all work together to provide you with the care you need, when you need it.  Your next appointment:    1 year(s)   Provider:   You may see Boyce Byes, MD or one of the following Advanced Practice Providers on your designated Care Team:   Mertha Abrahams, [place]     We recommend signing up for the patient portal called "MyChart".  Sign up information is provided on this After Visit Summary.  MyChart is used to connect with patients for Virtual Visits (Telemedicine).  Patients are able to view lab/test results, encounter notes, upcoming appointments, etc.  Non-urgent messages can be sent to your provider as well.   To learn more about what you can do with MyChart, go to [email or web address].   Other Instructions

## 2024-06-15 DIAGNOSIS — H25813 Combined forms of age-related cataract, bilateral: Secondary | ICD-10-CM | POA: Diagnosis not present

## 2024-06-15 DIAGNOSIS — H47323 Drusen of optic disc, bilateral: Secondary | ICD-10-CM | POA: Diagnosis not present

## 2024-06-15 DIAGNOSIS — H02403 Unspecified ptosis of bilateral eyelids: Secondary | ICD-10-CM | POA: Diagnosis not present

## 2024-06-15 DIAGNOSIS — H15122 Nodular episcleritis, left eye: Secondary | ICD-10-CM | POA: Diagnosis not present

## 2024-06-15 DIAGNOSIS — H04123 Dry eye syndrome of bilateral lacrimal glands: Secondary | ICD-10-CM | POA: Diagnosis not present

## 2024-06-15 DIAGNOSIS — H02834 Dermatochalasis of left upper eyelid: Secondary | ICD-10-CM | POA: Diagnosis not present

## 2024-06-15 DIAGNOSIS — H02831 Dermatochalasis of right upper eyelid: Secondary | ICD-10-CM | POA: Diagnosis not present

## 2024-06-15 DIAGNOSIS — H526 Other disorders of refraction: Secondary | ICD-10-CM | POA: Diagnosis not present

## 2024-07-21 ENCOUNTER — Other Ambulatory Visit: Payer: Self-pay | Admitting: Cardiology

## 2024-07-21 DIAGNOSIS — Z23 Encounter for immunization: Secondary | ICD-10-CM | POA: Diagnosis not present

## 2024-07-21 DIAGNOSIS — M8580 Other specified disorders of bone density and structure, unspecified site: Secondary | ICD-10-CM | POA: Diagnosis not present

## 2024-07-21 DIAGNOSIS — M542 Cervicalgia: Secondary | ICD-10-CM | POA: Diagnosis not present

## 2024-07-21 DIAGNOSIS — I480 Paroxysmal atrial fibrillation: Secondary | ICD-10-CM

## 2024-07-21 NOTE — Telephone Encounter (Signed)
 Prescription refill request for Eliquis  received. Indication:afib Last office visit:8/25 Scr:1.04  4/25 Age: 73 Weight:67.6  kg  Prescription refilled

## 2024-07-27 ENCOUNTER — Other Ambulatory Visit: Payer: Self-pay | Admitting: Internal Medicine

## 2024-07-27 ENCOUNTER — Ambulatory Visit
Admission: RE | Admit: 2024-07-27 | Discharge: 2024-07-27 | Disposition: A | Discharge status: Home / Self Care | Source: Ambulatory Visit | Attending: Internal Medicine | Admitting: Internal Medicine

## 2024-07-27 DIAGNOSIS — M542 Cervicalgia: Secondary | ICD-10-CM

## 2024-07-27 DIAGNOSIS — R350 Frequency of micturition: Secondary | ICD-10-CM | POA: Diagnosis not present

## 2024-07-27 DIAGNOSIS — R300 Dysuria: Secondary | ICD-10-CM | POA: Diagnosis not present

## 2024-07-27 DIAGNOSIS — M47812 Spondylosis without myelopathy or radiculopathy, cervical region: Secondary | ICD-10-CM | POA: Diagnosis not present

## 2024-07-30 DIAGNOSIS — E039 Hypothyroidism, unspecified: Secondary | ICD-10-CM | POA: Diagnosis not present

## 2024-07-30 DIAGNOSIS — I480 Paroxysmal atrial fibrillation: Secondary | ICD-10-CM | POA: Diagnosis not present

## 2024-07-30 DIAGNOSIS — F419 Anxiety disorder, unspecified: Secondary | ICD-10-CM | POA: Diagnosis not present

## 2024-07-30 DIAGNOSIS — K219 Gastro-esophageal reflux disease without esophagitis: Secondary | ICD-10-CM | POA: Diagnosis not present

## 2024-07-30 DIAGNOSIS — N951 Menopausal and female climacteric states: Secondary | ICD-10-CM | POA: Diagnosis not present

## 2024-07-30 DIAGNOSIS — M8580 Other specified disorders of bone density and structure, unspecified site: Secondary | ICD-10-CM | POA: Diagnosis not present

## 2024-07-30 DIAGNOSIS — N898 Other specified noninflammatory disorders of vagina: Secondary | ICD-10-CM | POA: Diagnosis not present

## 2024-07-30 DIAGNOSIS — N183 Chronic kidney disease, stage 3 unspecified: Secondary | ICD-10-CM | POA: Diagnosis not present

## 2024-07-30 DIAGNOSIS — E7800 Pure hypercholesterolemia, unspecified: Secondary | ICD-10-CM | POA: Diagnosis not present

## 2024-10-26 ENCOUNTER — Other Ambulatory Visit: Payer: Self-pay | Admitting: Internal Medicine

## 2024-10-26 DIAGNOSIS — Z1231 Encounter for screening mammogram for malignant neoplasm of breast: Secondary | ICD-10-CM

## 2024-12-29 ENCOUNTER — Ambulatory Visit
